# Patient Record
Sex: Female | Born: 1985 | Race: Black or African American | Hispanic: No | Marital: Single | Smoking: Never smoker
Health system: Southern US, Community
[De-identification: ages and names within clinical notes are randomized; demographics above are authoritative.]

## PROBLEM LIST (undated history)

## (undated) ENCOUNTER — Inpatient Hospital Stay: Payer: Self-pay

## (undated) DIAGNOSIS — D352 Benign neoplasm of pituitary gland: Secondary | ICD-10-CM

## (undated) DIAGNOSIS — G5751 Tarsal tunnel syndrome, right lower limb: Secondary | ICD-10-CM

## (undated) DIAGNOSIS — G43909 Migraine, unspecified, not intractable, without status migrainosus: Secondary | ICD-10-CM

## (undated) DIAGNOSIS — J189 Pneumonia, unspecified organism: Secondary | ICD-10-CM

## (undated) DIAGNOSIS — M1990 Unspecified osteoarthritis, unspecified site: Secondary | ICD-10-CM

## (undated) DIAGNOSIS — K121 Other forms of stomatitis: Secondary | ICD-10-CM

## (undated) DIAGNOSIS — M352 Behcet's disease: Secondary | ICD-10-CM

## (undated) DIAGNOSIS — L519 Erythema multiforme, unspecified: Secondary | ICD-10-CM

## (undated) DIAGNOSIS — M722 Plantar fascial fibromatosis: Secondary | ICD-10-CM

## (undated) DIAGNOSIS — M62461 Contracture of muscle, right lower leg: Secondary | ICD-10-CM

## (undated) DIAGNOSIS — J45909 Unspecified asthma, uncomplicated: Secondary | ICD-10-CM

## (undated) DIAGNOSIS — R0600 Dyspnea, unspecified: Secondary | ICD-10-CM

## (undated) HISTORY — PX: OTHER SURGICAL HISTORY: SHX169

---
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
## 2007-06-21 ENCOUNTER — Emergency Department: Payer: Self-pay | Admitting: Emergency Medicine

## 2007-08-13 ENCOUNTER — Ambulatory Visit: Payer: Self-pay | Admitting: Family Medicine

## 2007-11-01 ENCOUNTER — Ambulatory Visit: Payer: Self-pay | Admitting: Certified Nurse Midwife

## 2008-03-03 ENCOUNTER — Emergency Department: Payer: Self-pay | Admitting: Internal Medicine

## 2008-03-14 ENCOUNTER — Ambulatory Visit: Payer: Self-pay | Admitting: Pediatrics

## 2008-12-11 DIAGNOSIS — D352 Benign neoplasm of pituitary gland: Secondary | ICD-10-CM

## 2008-12-11 HISTORY — DX: Benign neoplasm of pituitary gland: D35.2

## 2009-07-03 ENCOUNTER — Emergency Department: Payer: Self-pay | Admitting: Internal Medicine

## 2010-07-07 ENCOUNTER — Emergency Department: Payer: Self-pay | Admitting: Emergency Medicine

## 2010-12-11 DIAGNOSIS — J45909 Unspecified asthma, uncomplicated: Secondary | ICD-10-CM

## 2010-12-11 HISTORY — DX: Unspecified asthma, uncomplicated: J45.909

## 2011-01-10 ENCOUNTER — Emergency Department: Payer: Self-pay | Admitting: Unknown Physician Specialty

## 2011-05-01 ENCOUNTER — Ambulatory Visit: Payer: Self-pay | Admitting: Family Medicine

## 2011-08-02 ENCOUNTER — Emergency Department: Payer: Self-pay | Admitting: *Deleted

## 2011-11-13 ENCOUNTER — Emergency Department (HOSPITAL_COMMUNITY)
Admission: EM | Admit: 2011-11-13 | Discharge: 2011-11-14 | Disposition: A | Discharge status: Home / Self Care | Payer: Self-pay | Attending: Emergency Medicine | Admitting: Emergency Medicine

## 2011-11-13 ENCOUNTER — Encounter: Payer: Self-pay | Admitting: Emergency Medicine

## 2011-11-13 ENCOUNTER — Emergency Department (HOSPITAL_COMMUNITY): Payer: Self-pay

## 2011-11-13 DIAGNOSIS — S139XXA Sprain of joints and ligaments of unspecified parts of neck, initial encounter: Secondary | ICD-10-CM | POA: Insufficient documentation

## 2011-11-13 DIAGNOSIS — IMO0002 Reserved for concepts with insufficient information to code with codable children: Secondary | ICD-10-CM

## 2011-11-13 DIAGNOSIS — Z331 Pregnant state, incidental: Secondary | ICD-10-CM

## 2011-11-13 DIAGNOSIS — S239XXA Sprain of unspecified parts of thorax, initial encounter: Secondary | ICD-10-CM | POA: Insufficient documentation

## 2011-11-13 DIAGNOSIS — M542 Cervicalgia: Secondary | ICD-10-CM | POA: Insufficient documentation

## 2011-11-13 DIAGNOSIS — O99891 Other specified diseases and conditions complicating pregnancy: Secondary | ICD-10-CM | POA: Insufficient documentation

## 2011-11-13 DIAGNOSIS — M549 Dorsalgia, unspecified: Secondary | ICD-10-CM | POA: Insufficient documentation

## 2011-11-13 DIAGNOSIS — S161XXA Strain of muscle, fascia and tendon at neck level, initial encounter: Secondary | ICD-10-CM
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
## 2011-11-15 ENCOUNTER — Encounter (HOSPITAL_COMMUNITY): Payer: Self-pay

## 2011-11-15 ENCOUNTER — Emergency Department (HOSPITAL_COMMUNITY)
Admission: EM | Admit: 2011-11-15 | Discharge: 2011-11-15 | Discharge status: Against Medical Advice | Payer: Self-pay | Attending: Emergency Medicine | Admitting: Emergency Medicine

## 2011-11-15 DIAGNOSIS — Z0389 Encounter for observation for other suspected diseases and conditions ruled out: Secondary | ICD-10-CM | POA: Insufficient documentation

## 2011-11-15 NOTE — ED Notes (Signed)
Pt reports she has had vaginal bleeding x 1 episode, reports having a vaginal discharge at present.

## 2011-11-15 NOTE — ED Notes (Signed)
Left post triage 
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
## 2012-03-16 ENCOUNTER — Emergency Department: Payer: Self-pay | Admitting: Emergency Medicine

## 2012-04-19 ENCOUNTER — Emergency Department: Payer: Self-pay | Admitting: Emergency Medicine

## 2012-04-19 LAB — URINALYSIS, COMPLETE
Bilirubin,UR: NEGATIVE
RBC,UR: 2 /HPF (ref 0–5)
Specific Gravity: 1.025 (ref 1.003–1.030)
Squamous Epithelial: 1
WBC UR: 1 /HPF (ref 0–5)

## 2012-04-19 LAB — COMPREHENSIVE METABOLIC PANEL
Albumin: 3.8 g/dL (ref 3.4–5.0)
Alkaline Phosphatase: 58 U/L (ref 50–136)
Anion Gap: 6 — ABNORMAL LOW (ref 7–16)
Chloride: 109 mmol/L — ABNORMAL HIGH (ref 98–107)
Co2: 25 mmol/L (ref 21–32)
EGFR (Non-African Amer.): >60
Osmolality: 278 (ref 275–301)
Potassium: 3.9 mmol/L (ref 3.5–5.1)

## 2012-04-19 LAB — CBC
HGB: 13.4 g/dL (ref 12.0–16.0)
MCH: 30.7 pg (ref 26.0–34.0)
MCHC: 33.5 g/dL (ref 32.0–36.0)
Platelet: 284 10*3/uL (ref 150–440)
WBC: 5.7 10*3/uL (ref 3.6–11.0)

## 2012-04-19 LAB — PREGNANCY, URINE: Pregnancy Test, Urine: NEGATIVE m[IU]/mL

## 2012-04-30 ENCOUNTER — Observation Stay: Payer: Self-pay | Admitting: Internal Medicine

## 2012-04-30 LAB — BASIC METABOLIC PANEL
Anion Gap: 12 (ref 7–16)
BUN: 9 mg/dL (ref 7–18)
Creatinine: 0.9 mg/dL (ref 0.60–1.30)
EGFR (African American): >60
EGFR (Non-African Amer.): >60
Glucose: 92 mg/dL (ref 65–99)

## 2012-04-30 LAB — CBC WITH DIFFERENTIAL/PLATELET
Eosinophil #: 0 10*3/uL (ref 0.0–0.7)
HCT: 42.1 % (ref 35.0–47.0)
Neutrophil #: 13.8 10*3/uL — ABNORMAL HIGH (ref 1.4–6.5)
Platelet: 320 10*3/uL (ref 150–440)
RBC: 4.55 10*6/uL (ref 3.80–5.20)

## 2012-04-30 LAB — PREGNANCY, URINE: Pregnancy Test, Urine: NEGATIVE m[IU]/mL

## 2012-09-11 LAB — HM HIV SCREENING LAB: HM HIV Screening: NEGATIVE

## 2012-09-21 ENCOUNTER — Emergency Department: Payer: Self-pay | Admitting: Emergency Medicine

## 2013-01-29 ENCOUNTER — Emergency Department: Payer: Self-pay | Admitting: Emergency Medicine

## 2013-03-19 ENCOUNTER — Emergency Department: Payer: Self-pay | Admitting: Emergency Medicine

## 2013-03-19 LAB — URINALYSIS, COMPLETE
Blood: NEGATIVE
Glucose,UR: NEGATIVE mg/dL (ref 0–75)
Leukocyte Esterase: NEGATIVE
Ph: 6 (ref 4.5–8.0)
Protein: NEGATIVE
WBC UR: 1 /HPF (ref 0–5)

## 2013-07-23 ENCOUNTER — Emergency Department: Payer: Self-pay | Admitting: Emergency Medicine

## 2013-09-24 ENCOUNTER — Emergency Department: Payer: Self-pay | Admitting: Emergency Medicine

## 2013-12-16 ENCOUNTER — Emergency Department: Payer: Self-pay | Admitting: Emergency Medicine

## 2013-12-16 LAB — RAPID INFLUENZA A&B ANTIGENS

## 2013-12-19 LAB — BETA STREP CULTURE(ARMC)

## 2014-04-04 ENCOUNTER — Emergency Department: Payer: Self-pay | Admitting: Emergency Medicine

## 2014-09-20 ENCOUNTER — Emergency Department: Payer: Self-pay | Admitting: Emergency Medicine

## 2014-10-12 ENCOUNTER — Encounter (HOSPITAL_COMMUNITY): Payer: Self-pay

## 2015-02-08 ENCOUNTER — Emergency Department: Payer: Self-pay | Admitting: Emergency Medicine

## 2015-02-24 LAB — HM PAP SMEAR: HM Pap smear: NEGATIVE

## 2015-04-04 ENCOUNTER — Emergency Department
Admit: 2015-04-04 | Disposition: A | Discharge status: Home / Self Care | Payer: Self-pay | Admitting: Emergency Medicine

## 2015-04-04 NOTE — Discharge Summary (Signed)
PATIENT NAMEELISA, [name] MR#:  [id number] DATE OF BIRTH:  Jun 21, 1986  DATE OF ADMISSION:  04/30/2012 DATE OF DISCHARGE:  05/02/2012  DISCHARGE DIAGNOSES:  1. Angioedema of unclear etiology.  2. Leukocytosis.   DISPOSITION: The patient is being discharged home. Follow-up with PCP in 1 to 2 weeks after discharge.   DIET: Regular.   ACTIVITY: As tolerated.   DISCHARGE MEDICATIONS:  1. Pepcid 20 mg daily.  2. Benadryl 25 mg every six hours p.r.n.  3. Prednisone taper as prescribed.  RESULTS:  CBC normal other than white count of 14.3.  BMP normal.  HOSPITAL COURSE: The patient is a 29 year old female with no significant past medical history who presented with swelling of her tongue and lips. The patient reported that this was her third episode in the last month. She denied any allergies or having similar symptoms in the past at a younger age. She has no family history of similar symptoms. She came to the ER during one of those episodes and a C4 level was checked at that time and was normal. She also had C1 esterase beta levels checked on 04/22/2012 which were normal. The patient was admitted to the hospital and started on p.r.n. Benadryl, Pepcid, and steroids with improvement. Throughout the hospitalization, the patient was not in any respiratory distress with good oxygen saturations. She was able to swallow and handle her secretions without any difficulty. With conservative management she remained stable. She is being discharged home in a stable condition. She has been advised to follow-up at [facility] if she has recurrent of her symptoms.   TIME SPENT: 45 minutes.   ____________________________ Cherre Huger, MD sp:slb D: 05/02/2012 13:03:40 ET T: 05/03/2012 12:05:24 ET JOB#: [id number]  cc: Cherre Huger, MD, <Dictator> Cherre Huger MD ELECTRONICALLY SIGNED 05/03/2012 14:00

## 2015-04-04 NOTE — H&P (Signed)
PATIENT NAMEAMARYAH, [name] MR#:  [id number] DATE OF BIRTH:  1986/10/26  DATE OF ADMISSION:  04/30/2012  PRIMARY CARE PHYSICIAN: None.   CHIEF COMPLAINT: Angioedema.   HISTORY OF PRESENT ILLNESS: The patient is a 29 year old female with no past medical history who presents with angioedema. She has had three episodes of angioedema in the last month. She says her tongue is swollen and her lips are swollen. No other symptoms are associated with this except for the fact that she says that she has a mildly sore throat. No fever or chills or drooling is noted. She has no respiratory compromise.   REVIEW OF SYSTEMS: CONSTITUTIONAL: No fever, fatigue, weakness, weight loss, weight gain. EYES: No blurred or double vision, glaucoma. ENT: No ear pain, hearing loss, seasonal allergies. No postnasal drip. RESPIRATORY: No cough, wheezing, hemoptysis, or dyspnea. CARDIOVASCULAR: No chest pain, orthopnea, palpitations, syncope, edema, arrhythmia, dyspnea on exertion. GI: No nausea, vomiting, diarrhea, abdominal pain, melena, or ulcers. GENITOURINARY: No dysuria, hematuria, frequency, or urgency. ENDOCRINE: No polyuria, polydipsia, thyroid problems, heat or cold intolerance. SKIN: No rash or lesions. HEMATOLOGIC/LYMPHATIC: No anemia or easy bruising. MUSCULOSKELETAL: No limited activity. No arthritis. NEUROLOGICAL:  No history of cerebrovascular accident, transient ischemic attack, seizures or ataxia. PSYCHIATRIC: No history of anxiety or depression.   PAST MEDICAL HISTORY: None.   PAST SURGICAL HISTORY: Cesarean section. Marland Kitchen   MEDICATIONS: Depo shot every three months, the last one was in April.   SOCIAL HISTORY: No tobacco, alcohol, or drug use.   FAMILY HISTORY: No history of hypertension, diabetes, or stroke. Marland Kitchen   ALLERGIES: No known drug allergies.  PHYSICAL EXAMINATION:  VITAL SIGNS: Temperature 99, pulse 92, respirations 20, blood pressure 107/71, 99% on room air.   GENERAL: The patient is alert,  oriented, not in acute distress.   HEENT: Head is atraumatic. Pupils are round and reactive. Sclerae are anicteric. The patient has a swollen tongue without any respiratory compromise, and her lips are also swollen.   NECK: Supple without jugular venous distention, carotid bruit, or enlarged thyroid. No lymphadenopathy.   CARDIOVASCULAR: Regular rate and rhythm. No murmurs, gallops, or rubs. PMI is not displaced.   LUNGS: Clear to auscultation bilaterally without crackles, rales, rhonchi or wheezing. Normal to percussion.   BACK: No costovertebral angle tenderness or vertebral tenderness.   ABDOMEN: Bowel sounds are positive. Nontender, nondistended. No hepatosplenomegaly.   EXTREMITIES: No clubbing, cyanosis, or edema.   NEUROLOGICAL:   Cranial nerves II through XII are intact. No focal deficits.   MUSCULOSKELETALL:  The patient is able to move all extremities with 5 out of 5 strength.   LABORATORY, DIAGNOSTIC AND RADIOLOGICAL DATA: Laboratories are pending.   ASSESSMENT AND PLAN: A 29 year old female with her third episode of angioedema, without any significant respiratory compromise at this time, who is admitted for observation.   Angioedema of unclear etiology: Likely idiopathic versus hereditary versus an allergen. The patient denies any new exposures, nothing has changed.  She is not on any medications that could possibly cause angioedema and no family history. At this time, this is idiopathic acquired angioedema. She has no evidence of stridor, respiratory distress, or decreasing oxygen level. She   will be admitted for observation. We will continue Solu-Medrol, Pepcid and Benadryl. She will need an outpatient allergist at discharge.   ____________________________ Jehan Ranganathan P. Benjie Karvonen, MD spm:cbb D: 04/30/2012 14:57:55 ET T: 04/30/2012 15:36:56 ET JOB#: [id number]  cc: Mattheu Brodersen P. Benjie Karvonen, MD, <Dictator> Donell Beers Jaelen Gellerman MD ELECTRONICALLY SIGNED 04/30/2012  16:54 

## 2015-04-07 LAB — BETA STREP CULTURE(ARMC)

## 2015-05-01 ENCOUNTER — Emergency Department
Admission: EM | Admit: 2015-05-01 | Discharge: 2015-05-02 | Discharge status: Against Medical Advice | Payer: Medicaid Other | Attending: Emergency Medicine | Admitting: Emergency Medicine

## 2015-05-01 DIAGNOSIS — Z880 Allergy status to penicillin: Secondary | ICD-10-CM | POA: Diagnosis not present

## 2015-05-01 DIAGNOSIS — Z331 Pregnant state, incidental: Secondary | ICD-10-CM | POA: Diagnosis not present

## 2015-05-01 DIAGNOSIS — R1030 Lower abdominal pain, unspecified: Secondary | ICD-10-CM | POA: Diagnosis present

## 2015-05-01 LAB — URINALYSIS COMPLETE WITH MICROSCOPIC (ARMC ONLY)
BACTERIA UA: NONE SEEN
BILIRUBIN URINE: NEGATIVE
GLUCOSE, UA: NEGATIVE mg/dL
HGB URINE DIPSTICK: NEGATIVE
Ketones, ur: NEGATIVE mg/dL
Leukocytes, UA: NEGATIVE
NITRITE: NEGATIVE
Protein, ur: NEGATIVE mg/dL
Specific Gravity, Urine: 1.024 (ref 1.005–1.030)
pH: 6 (ref 5.0–8.0)

## 2015-05-01 LAB — CBC WITH DIFFERENTIAL/PLATELET
BASOS PCT: 1 %
Basophils Absolute: 0.1 10*3/uL (ref 0–0.1)
EOS ABS: 0.1 10*3/uL (ref 0–0.7)
Eosinophils Relative: 2 %
HCT: 38 % (ref 35.0–47.0)
Hemoglobin: 12.7 g/dL (ref 12.0–16.0)
LYMPHS ABS: 3 10*3/uL (ref 1.0–3.6)
Lymphocytes Relative: 42 %
MCH: 29.9 pg (ref 26.0–34.0)
MCHC: 33.3 g/dL (ref 32.0–36.0)
MCV: 89.9 fL (ref 80.0–100.0)
MONO ABS: 0.8 10*3/uL (ref 0.2–0.9)
MONOS PCT: 11 %
Neutro Abs: 3.1 10*3/uL (ref 1.4–6.5)
Neutrophils Relative %: 44 %
Platelets: 356 10*3/uL (ref 150–440)
RBC: 4.23 MIL/uL (ref 3.80–5.20)
RDW: 12.9 % (ref 11.5–14.5)
WBC: 7.1 10*3/uL (ref 3.6–11.0)

## 2015-05-01 LAB — COMPREHENSIVE METABOLIC PANEL
ALK PHOS: 62 U/L (ref 38–126)
ALT: 12 U/L — ABNORMAL LOW (ref 14–54)
ANION GAP: 6 (ref 5–15)
AST: 16 U/L (ref 15–41)
Albumin: 3.6 g/dL (ref 3.5–5.0)
BUN: 7 mg/dL (ref 6–20)
CALCIUM: 9.2 mg/dL (ref 8.9–10.3)
CO2: 27 mmol/L (ref 22–32)
Chloride: 106 mmol/L (ref 101–111)
Creatinine, Ser: 0.92 mg/dL (ref 0.44–1.00)
GFR calc non Af Amer: >60 mL/min (ref >60)
Glucose, Bld: 79 mg/dL (ref 65–99)
Potassium: 3.4 mmol/L — ABNORMAL LOW (ref 3.5–5.1)
SODIUM: 139 mmol/L (ref 135–145)
Total Bilirubin: 0.4 mg/dL (ref 0.3–1.2)
Total Protein: 7.4 g/dL (ref 6.5–8.1)

## 2015-05-01 LAB — POCT PREGNANCY, URINE: PREG TEST UR: POSITIVE — AB

## 2015-05-01 NOTE — ED Notes (Signed)
Lab notified to add hcg to blood work

## 2015-05-01 NOTE — Discharge Instructions (Signed)
Abdominal Pain During Pregnancy Abdominal pain is common in pregnancy. Most of the time, it does not cause harm. There are many causes of abdominal pain. Some causes are more serious than others. Some of the causes of abdominal pain in pregnancy are easily diagnosed. Occasionally, the diagnosis takes time to understand. Other times, the cause is not determined. Abdominal pain can be a sign that something is very wrong with the pregnancy, or the pain may have nothing to do with the pregnancy at all. For this reason, always tell your health care provider if you have any abdominal discomfort. HOME CARE INSTRUCTIONS  Monitor your abdominal pain for any changes. The following actions may help to alleviate any discomfort you are experiencing:  Do not have sexual intercourse or put anything in your vagina until your symptoms go away completely.  Get plenty of rest until your pain improves.  Drink clear fluids if you feel nauseous. Avoid solid food as long as you are uncomfortable or nauseous.  Only take over-the-counter or prescription medicine as directed by your health care provider.  Keep all follow-up appointments with your health care provider. SEEK IMMEDIATE MEDICAL CARE IF:  You are bleeding, leaking fluid, or passing tissue from the vagina.  You have increasing pain or cramping.  You have persistent vomiting.  You have painful or bloody urination.  You have a fever.  You notice a decrease in your baby's movements.  You have extreme weakness or feel faint.  You have shortness of breath, with or without abdominal pain.  You develop a severe headache with abdominal pain.  You have abnormal vaginal discharge with abdominal pain.  You have persistent diarrhea.  You have abdominal pain that continues even after rest, or gets worse. MAKE SURE YOU:   Understand these instructions.  Will watch your condition.  Will get help right away if you are not doing well or get  worse. Document Released: 11/27/2005 Document Revised: 09/17/2013 Document Reviewed: 06/26/2013 [facility] Patient Information 2015 [place], [place]. This information is not intended to replace advice given to you by your health care provider. Make sure you discuss any questions you have with your health care providerPlease be sure to follow up with OB very soon. RETURN AT ONCE IF ABDOMINAL PAIN GETS WORSE OR IF  YOU HAVE BLEEDING OR A FEVER.

## 2015-05-01 NOTE — ED Notes (Signed)
Patient states she wants to leave, she has to work at [employer] and has to be up at [employer].  She does not want to wait for [place] and Pelvic Exam.  Dr. Cinda Quest notified.

## 2015-05-01 NOTE — ED Notes (Signed)
Pt c/o abd pain and n/v since Monday. Denies vaginal bleeding or discharge. Reports being 1 week late for menstrual cycle.

## 2015-05-01 NOTE — ED Provider Notes (Signed)
[facility] Emergency Department Provider Note  ____________________________________________  Time seen: Approximately 11:21 PM  I have reviewed the triage vital signs and the nursing notes.   HISTORY  Chief Complaint Abdominal Pain    HPI [name] is a 29 y.o. female who complains of lower abdominal pain starting today at somewhat crampy in nature gets better when she passes gas she also reports she's been nauseated for the last day or so which she smells food nothing else really seems to modify the pain or nausea is mild in natur and comes and goes especially after goes after she passes gas e   No past medical history on file.  There are no active problems to display for this patient.   Past Surgical History  Procedure Laterality Date  . Cesarean section      No current outpatient prescriptions on file.  Allergies Ibuprofen and Penicillins  No family history on file.  Social History History  Substance Use Topics  . Smoking status: Never Smoker   . Smokeless tobacco: Not on file  . Alcohol Use: No    Review of Systems Constitutional: No fever/chills Eyes: No visual changes. ENT: No sore throat. Cardiovascular: Denies chest pain. Respiratory: Denies shortness of breath. Gastrointestinal:  no vomiting.  No diarrhea.  No constipation. Genitourinary: Negative for dysuria. Musculoskeletal: Negative for back pain. Skin: Negative for rash. Neurological: Negative for headaches, focal weakness or numbness.  10-point ROS otherwise negative.  ____________________________________________   PHYSICAL EXAM:  VITAL SIGNS: ED Triage Vitals  Enc Vitals Group     BP 05/01/15 1943 120/67 mmHg     Pulse Rate 05/01/15 1943 86     Resp 05/01/15 2230 18     Temp 05/01/15 1943 97.6 F (36.4 C)     Temp Source 05/01/15 1943 Oral     SpO2 05/01/15 1943 100 %     Weight 05/01/15 1943 207 lb (93.895 kg)     Height 05/01/15 1943 5\' 4"   (1.626 m)     Head Cir --      Peak Flow --      Pain Score 05/01/15 1944 6     Pain Loc --      Pain Edu? --      Excl. in [place]? --     Constitutional: Alert and oriented. Well appearing and in no acute distress. Eyes: Conjunctivae are normal. PERRL. EOMI. Head: Atraumatic. Nose: No congestion/rhinnorhea. Mouth/Throat: Mucous membranes are moist.  Oropharynx non-erythematous. Neck: No stridor.  }Cardiovascular: Normal rate, regular rhythm. Grossly normal heart sounds.  Good peripheral circulation. Respiratory: Normal respiratory effort.  No retractions. Lungs CTAB. Gastrointestinal: Soft and nontender. Except for mild tenderness on deep palpation immediately suprapubically No distention. No abdominal bruits. No CVA tenderness. Musculoskeletal: No lower extremity tenderness nor edema.  No joint effusions. Neurologic:  Normal speech and language. No gross focal neurologic deficits are appreciated. Speech is normal. No gait instability. Skin:  Skin is warm, dry and intact. No rash noted. Psychiatric: Mood and affect are normal. Speech and behavior are normal.  ____________________________________________   LABS (all labs ordered are listed, but only abnormal results are displayed)  Labs Reviewed  URINALYSIS COMPLETEWITH MICROSCOPIC ([place])  - Abnormal; Notable for the following:    Color, Urine YELLOW (*)    APPearance HAZY (*)    Squamous Epithelial / LPF 0-5 (*)    All other components within normal limits  COMPREHENSIVE METABOLIC PANEL - Abnormal; Notable for the following:  Potassium 3.4 (*)    ALT 12 (*)    All other components within normal limits  POCT PREGNANCY, URINE - Abnormal; Notable for the following:    Preg Test, Ur POSITIVE (*)    All other components within normal limits  CHLAMYDIA/NGC RT PCR ([facility])   CBC WITH DIFFERENTIAL/PLATELET  POC URINE PREG, ED   ____________________________________________  EKG  Not  done ____________________________________________  RADIOLOGY   ____________________________________________   PROCEDURES  Procedure(s) performed: None  Critical Care performed: No  ____________________________________________   INITIAL IMPRESSION / ASSESSMENT AND PLAN / ED COURSE  Pertinent labs & imaging results that were available during my care of the patient were reviewed by me and considered in my medical decision making (see chart for details).  Patient, who wanted to leave initially, but who I was able to talk and staying now wants to leave again I will allow her to leave since the pain is very mild and resolves when she passes gas, with the understanding that she will return at once if the pain gets worse or if she begins having any bleeding or has any other problems and with the understanding that she will follow up immediately with the OB/GYN doctor next days ____________________________________________   FINAL CLINICAL IMPRESSION(S) / ED DIAGNOSES  Final diagnoses:  Lower abdominal pain     Kim Polio, MD 05/01/15 2358

## 2015-05-02 LAB — HCG, QUANTITATIVE, PREGNANCY: hCG, Beta Chain, Quant, S: 6024 m[IU]/mL — ABNORMAL HIGH (ref <5)

## 2015-05-02 NOTE — ED Notes (Signed)
Patient decided to leave AMA.  AMA signature obtained.  Risks explained.  Information given on when to return to the ER and to follow up ASAP.

## 2015-05-03 ENCOUNTER — Emergency Department
Admission: EM | Admit: 2015-05-03 | Discharge: 2015-05-03 | Disposition: A | Discharge status: Home / Self Care | Payer: Medicaid Other | Attending: Emergency Medicine | Admitting: Emergency Medicine

## 2015-05-03 ENCOUNTER — Encounter: Payer: Self-pay | Admitting: Emergency Medicine

## 2015-05-03 DIAGNOSIS — O219 Vomiting of pregnancy, unspecified: Secondary | ICD-10-CM

## 2015-05-03 DIAGNOSIS — M545 Low back pain: Secondary | ICD-10-CM | POA: Insufficient documentation

## 2015-05-03 DIAGNOSIS — Z880 Allergy status to penicillin: Secondary | ICD-10-CM | POA: Diagnosis not present

## 2015-05-03 DIAGNOSIS — O210 Mild hyperemesis gravidarum: Secondary | ICD-10-CM | POA: Insufficient documentation

## 2015-05-03 DIAGNOSIS — Z3A01 Less than 8 weeks gestation of pregnancy: Secondary | ICD-10-CM | POA: Diagnosis not present

## 2015-05-03 DIAGNOSIS — O9989 Other specified diseases and conditions complicating pregnancy, childbirth and the puerperium: Secondary | ICD-10-CM | POA: Diagnosis not present

## 2015-05-03 MED ORDER — PROMETHAZINE HCL 25 MG PO TABS
12.5000 mg | ORAL_TABLET | Freq: Once | ORAL | Status: AC
  Administered 2015-05-03: 12.5 mg via ORAL

## 2015-05-03 MED ORDER — PROMETHAZINE HCL 25 MG PO TABS
ORAL_TABLET | ORAL | Status: AC
  Administered 2015-05-03: 12.5 mg via ORAL
  Filled 2015-05-03: qty 1

## 2015-05-03 MED ORDER — DOXYLAMINE-PYRIDOXINE 10-10 MG PO TBEC: 2.0000 | DELAYED_RELEASE_TABLET | Freq: Every day | ORAL | Status: DC

## 2015-05-03 NOTE — ED Provider Notes (Signed)
[facility] Emergency Department Provider Note  ____________________________________________  Time seen: Approximately 4:45 PM  I have reviewed the triage vital signs and the nursing notes.   HISTORY  Chief Complaint Emesis During Pregnancy    HPI [name] is a 29 y.o. female presents to the emergency department for nausea during pregnancy. She denies vomiting or abdominal pain. She states that she is currently on an antibiotic for UTI diagnosed by [facility]. She states that she becomes extremely nauseated after eating. She found out approximately 1 week ago that she is pregnant. She is not been seen by gynecology. Last menstrual cycle was 03/27/2015. She is gravida 2 para 2 abortion 0 (twins in the first pregnancy).She denies vaginal bleeding, discharge, or fluid leaking.   History reviewed. No pertinent past medical history.  There are no active problems to display for this patient.   Past Surgical History  Procedure Laterality Date  . Cesarean section      Current Outpatient Rx  Name  Route  Sig  Dispense  Refill  . Doxylamine-Pyridoxine 10-10 MG TBEC   Oral   Take 2 tablets by mouth at bedtime.   60 tablet   0     Allergies Ibuprofen and Penicillins  No family history on file.  Social History History  Substance Use Topics  . Smoking status: Never Smoker   . Smokeless tobacco: Not on file  . Alcohol Use: No    Review of Systems Constitutional: No fever/chills Eyes: No visual changes. ENT: No sore throat. Cardiovascular: Denies chest pain. Respiratory: Denies shortness of breath. Gastrointestinal: No abdominal pain.  Nausea, no vomiting.  No diarrhea.  No constipation. Genitourinary: Negative for dysuria. Musculoskeletal: Lower  back pain.  Skin: Negative for rash. Neurological: Negative for headaches, focal weakness or numbness.  10-point ROS otherwise  negative.  ____________________________________________   PHYSICAL EXAM:  VITAL SIGNS: ED Triage Vitals  Enc Vitals Group     BP 05/03/15 1303 120/68 mmHg     Pulse Rate 05/03/15 1303 86     Resp 05/03/15 1303 18     Temp 05/03/15 1302 98 F (36.7 C)     Temp Source 05/03/15 1302 Oral     SpO2 05/03/15 1303 100 %     Weight --      Height --      Head Cir --      Peak Flow --      Pain Score 05/03/15 1304 0     Pain Loc --      Pain Edu? --      Excl. in [place]? --     Constitutional: Alert and oriented. Well appearing and in no acute distress. Eyes: Conjunctivae are normal. PERRL. EOMI. Head: Atraumatic. Nose: No congestion/rhinnorhea. Mouth/Throat: Mucous membranes are moist.  Oropharynx non-erythematous. Neck: No stridor.   Cardiovascular: Normal rate, regular rhythm. Grossly normal heart sounds.  Good peripheral circulation. Respiratory: Normal respiratory effort.  No retractions. Lungs CTAB. Gastrointestinal: Soft and nontender. No distention. No abdominal bruits. No CVA tenderness. Musculoskeletal: No lower extremity tenderness nor edema.  No joint effusions. Neurologic:  Normal speech and language. No gross focal neurologic deficits are appreciated. Speech is normal. No gait instability. Skin:  Skin is warm, dry and intact. No rash noted. Psychiatric: Mood and affect are normal. Speech and behavior are normal.  ____________________________________________   LABS (all labs ordered are listed, but only abnormal results are displayed)  Labs Reviewed - No data to display  ____________________________________________  EKG   ____________________________________________  RADIOLOGY   ____________________________________________   PROCEDURES  Procedure(s) performed: None  Critical Care performed: No  ____________________________________________   INITIAL IMPRESSION / ASSESSMENT AND PLAN / ED COURSE  Pertinent labs & imaging results that were available  during my care of the patient were reviewed by me and considered in my medical decision making (see chart for details). Patient was given by mouth Phenergan while in the emergency department. She was then able to tolerate a full food tray without vomiting or nausea. She'll be discharged home to follow up with her primary care provider. She states that she intends to see [facility] to follow her through pregnancy. ____________________________________________   FINAL CLINICAL IMPRESSION(S) / ED DIAGNOSES  Final diagnoses:  Nausea and vomiting in pregnancy prior to [redacted] weeks gestation      Victorino Dike, FNP 05/03/15 1745  Hinda Kehr, MD 05/04/15 [phone]

## 2015-05-03 NOTE — ED Notes (Signed)
Patient states that she was here 2 days ago for same, had lab work but did not stay to get results, denies abd pain or bleeding, states has not tried anything for this nausea and vomiting yet.

## 2015-05-03 NOTE — ED Notes (Signed)
Today is hungry , last vomit was 10 days ago , has had some nausea, has had no prenatal care, lmp April 16 was aseen at urgent care for chills nausea, found out she was preg

## 2015-05-03 NOTE — ED Notes (Signed)
Last period 4/16

## 2015-05-10 ENCOUNTER — Emergency Department
Admission: EM | Admit: 2015-05-10 | Discharge: 2015-05-10 | Disposition: A | Discharge status: Home / Self Care | Payer: Medicaid Other | Attending: Emergency Medicine | Admitting: Emergency Medicine

## 2015-05-10 ENCOUNTER — Encounter: Payer: Self-pay | Admitting: Emergency Medicine

## 2015-05-10 DIAGNOSIS — Z880 Allergy status to penicillin: Secondary | ICD-10-CM | POA: Insufficient documentation

## 2015-05-10 DIAGNOSIS — B370 Candidal stomatitis: Secondary | ICD-10-CM | POA: Diagnosis not present

## 2015-05-10 DIAGNOSIS — O98811 Other maternal infectious and parasitic diseases complicating pregnancy, first trimester: Secondary | ICD-10-CM | POA: Insufficient documentation

## 2015-05-10 DIAGNOSIS — Z3A01 Less than 8 weeks gestation of pregnancy: Secondary | ICD-10-CM | POA: Insufficient documentation

## 2015-05-10 MED ORDER — ALUM & MAG HYDROXIDE-SIMETH 200-200-20 MG/5ML PO SUSP
ORAL | Status: AC
  Filled 2015-05-10: qty 30

## 2015-05-10 MED ORDER — NYSTATIN 100000 UNIT/ML MT SUSP: 5.0000 mL | Freq: Four times a day (QID) | OROMUCOSAL | Status: DC

## 2015-05-10 MED ORDER — LIDOCAINE VISCOUS 2 % MT SOLN
15.0000 mL | Freq: Once | OROMUCOSAL | Status: AC
  Administered 2015-05-10: 15 mL via OROMUCOSAL

## 2015-05-10 MED ORDER — OXYCODONE HCL 5 MG/5ML PO SOLN
ORAL | Status: AC
  Filled 2015-05-10: qty 5

## 2015-05-10 MED ORDER — LIDOCAINE VISCOUS 2 % MT SOLN
OROMUCOSAL | Status: DC
Start: 2015-05-10 — End: 2015-05-11
  Filled 2015-05-10: qty 15

## 2015-05-10 MED ORDER — LIDOCAINE VISCOUS 2 % MT SOLN: 20.0000 mL | OROMUCOSAL | Status: DC | PRN

## 2015-05-10 MED ORDER — NYSTATIN 100000 UNIT/ML MT SUSP
5.0000 mL | Freq: Four times a day (QID) | OROMUCOSAL | Status: DC
  Administered 2015-05-10: 500000 [IU] via ORAL
  Filled 2015-05-10 (×5): qty 5

## 2015-05-10 MED ORDER — GI COCKTAIL ~~LOC~~: 30.0000 mL | Freq: Once | ORAL | Status: DC

## 2015-05-10 MED ORDER — OXYCODONE HCL 5 MG/5ML PO SOLN: 5.0000 mg | ORAL | Status: DC | PRN

## 2015-05-10 MED ORDER — ALUM & MAG HYDROXIDE-SIMETH 200-200-20 MG/5ML PO SUSP
15.0000 mL | Freq: Once | ORAL | Status: AC
  Administered 2015-05-10: 15 mL via ORAL

## 2015-05-10 MED ORDER — OXYCODONE HCL 5 MG/5ML PO SOLN
7.5000 mg | Freq: Once | ORAL | Status: AC
  Administered 2015-05-10: 7.5 mg via ORAL

## 2015-05-10 NOTE — ED Provider Notes (Signed)
[facility] Emergency Department Provider Note  Time seen: 9:20 PM  I have reviewed the triage vital signs and the nursing notes.   HISTORY  Chief Complaint Allergic Reaction    HPI [name] is a 29 y.o. female with no past medical history who presents the emergency department with oral swelling and pain. The patient states she is approximately [redacted] weeks pregnant currently by her best estimate. Patient states for the past 2 days her mouth has been swollen with significant pain upon swallowing or trying to eat. She thinks this feels an allergic reaction to eating tomatoes several days ago. Denies any hives, itching, skin rash, trouble breathing. Describes some mouth pain as moderate, worse when trying to eat or drink.    History reviewed. No pertinent past medical history.  There are no active problems to display for this patient.   Past Surgical History  Procedure Laterality Date  . Cesarean section      Current Outpatient Rx  Name  Route  Sig  Dispense  Refill  . Doxylamine-Pyridoxine 10-10 MG TBEC   Oral   Take 2 tablets by mouth at bedtime.   60 tablet   0     Allergies Ibuprofen and Penicillins  No family history on file.  Social History History  Substance Use Topics  . Smoking status: Never Smoker   . Smokeless tobacco: Not on file  . Alcohol Use: No    Review of Systems Constitutional: Negative for fever. Eyes:  States bilateral pinkeye Cardiovascular: Negative for chest pain. Respiratory: Negative for shortness of breath. Gastrointestinal: Negative for abdominal pain Genitourinary: Negative for vaginal bleeding or discharge. Musculoskeletal: Negative for back pain. Skin: Negative for rash. Neurological: Negative for headache 10-point ROS otherwise negative.  ____________________________________________   PHYSICAL EXAM:  VITAL SIGNS: ED Triage Vitals  Enc Vitals Group     BP 05/10/15 2113 106/65 mmHg     Pulse  Rate 05/10/15 2113 103     Resp --      Temp 05/10/15 2113 100.5 F (38.1 C)     Temp Source 05/10/15 2113 Oral     SpO2 05/10/15 2113 100 %     Weight 05/10/15 2113 205 lb (92.987 kg)     Height 05/10/15 2113 5\' 4"  (1.626 m)     Head Cir --      Peak Flow --      Pain Score 05/10/15 2118 10     Pain Loc --      Pain Edu? --      Excl. in [place]? --     Constitutional: Alert and oriented. Well appearing and in no distress. Keeps mouth closed, only speaks one word sentences due to pain while speaking. Eyes: Bilateral conjunctival injection, no drainage noted. ENT   Head: Normocephalic and atraumatic.   Nose: No congestion/rhinnorhea.   Mouth/Throat: Diffuse white plaques throughout mouth, tongue and buccal mucosa. Skin appears to be sloughing off in areas of the buccal mucosa. Most consistent with significant/severe oral thrush. Cardiovascular: Normal rate, regular rhythm. Respiratory: Normal respiratory effort without tachypnea nor retractions. Breath sounds are clear. No wheeze. Gastrointestinal: Soft and nontender. No distention.   Musculoskeletal: Nontender with normal range of motion in all extremities.  Neurologic:  Normal speech and language. No gross focal neurologic deficits  Skin:  Skin is warm, dry and intact. No rash. No hives. Psychiatric: Mood and affect are normal. Speech and behavior are normal  ____________________________________________   INITIAL IMPRESSION /  ASSESSMENT AND PLAN / ED COURSE  Pertinent labs & imaging results that were available during my care of the patient were reviewed by me and considered in my medical decision making (see chart for details).  6-[redacted] weeks pregnant female presents with what appears to be severe oral thrush. Does not appear to be an anaphylactic reaction or angioedema. Patient did receive 50 mg IV Benadryl by EMS prior to arrival. Unfortunately most pinkeye fungal medications are class C, I will discuss with OB/GYN for  further recommendations for treatment. I will dose the patient with viscous lidocaine in the emergency department for symptom relief. I discussed with Dr. Leafy Ro, we both reviewed literature on the subject. It appears that nystatin has the least risk to the patient/fetus as it is absorbed in very low quantities into the bloodstream from the GI tract. I will discuss the risks/benefits with the patient, but as this is a very significant thrush infection I believe it needs to be treated for the patient's well being. I have also discussed with the patient other immunosuppressive conditions such as HIV. The patient states she has been tested for HIV and and is always been negative. I discussed with the patient the need to follow up with OB/GYN this week and have repeat HIV testing performed. The patient is agreeable to this plan.  ____________________________________________   FINAL CLINICAL IMPRESSION(S) / ED DIAGNOSES  Oral thrush   Harvest Dark, MD 05/10/15 2326

## 2015-05-10 NOTE — Discharge Instructions (Signed)

## 2015-05-10 NOTE — ED Notes (Signed)
Pt arrived to the ED via [facility] for complaints of "swollen moth and throat." Pt states that she ate tomatoes 2 days ago and she is allergic to them. Pt reports having this symptoms for 2 days now and is getting worse. Pt is AOx4 in no apparent distress, speaking softly because of painful mouth, no hives or itching reported at the time of triage. MD at bedside upon arrival and ruled out allergic reaction diagnosis. Pt received 50mg  of benadryl. Pt reports being [redacted] weeks pregnant.

## 2015-05-12 ENCOUNTER — Encounter: Payer: Self-pay | Admitting: Emergency Medicine

## 2015-05-12 ENCOUNTER — Emergency Department
Admission: EM | Admit: 2015-05-12 | Discharge: 2015-05-12 | Disposition: A | Discharge status: Home / Self Care | Payer: Medicaid Other | Attending: Emergency Medicine | Admitting: Emergency Medicine

## 2015-05-12 DIAGNOSIS — O99711 Diseases of the skin and subcutaneous tissue complicating pregnancy, first trimester: Secondary | ICD-10-CM | POA: Diagnosis present

## 2015-05-12 DIAGNOSIS — Z79899 Other long term (current) drug therapy: Secondary | ICD-10-CM | POA: Diagnosis not present

## 2015-05-12 DIAGNOSIS — L519 Erythema multiforme, unspecified: Secondary | ICD-10-CM

## 2015-05-12 DIAGNOSIS — Z880 Allergy status to penicillin: Secondary | ICD-10-CM | POA: Diagnosis not present

## 2015-05-12 DIAGNOSIS — Z3A00 Weeks of gestation of pregnancy not specified: Secondary | ICD-10-CM | POA: Diagnosis not present

## 2015-05-12 MED ORDER — SODIUM CHLORIDE 0.9 % IV SOLN
Freq: Once | INTRAVENOUS | Status: AC
  Administered 2015-05-12: 10:00:00 via INTRAVENOUS

## 2015-05-12 MED ORDER — DEXAMETHASONE SODIUM PHOSPHATE 10 MG/ML IJ SOLN
10.0000 mg | Freq: Once | INTRAMUSCULAR | Status: AC
  Administered 2015-05-12: 10 mg via INTRAVENOUS

## 2015-05-12 MED ORDER — DEXAMETHASONE SODIUM PHOSPHATE 10 MG/ML IJ SOLN
INTRAMUSCULAR | Status: AC
  Filled 2015-05-12: qty 1

## 2015-05-12 MED ORDER — PREDNISONE 10 MG PO TABS: 10.0000 mg | ORAL_TABLET | Freq: Every day | ORAL | Status: DC

## 2015-05-12 NOTE — ED Provider Notes (Signed)
[facility] Emergency Department Provider Note     Time seen: ----------------------------------------- 9:01 AM on 05/12/2015 -----------------------------------------    I have reviewed the triage vital signs and the nursing notes.   HISTORY  Chief Complaint Sore Throat    HPI [name] is a 29 y.o. female who presents ER for severe sore throat and mouth swelling. Patient states she's been having hard time swallowing, she was seen here 2 days ago for same but on antifungal medications. She states is worse she said would eat or drink anything and she is 2 months pregnant. States this happened about 3 months ago she was treated in [place] on her symptoms Better. Patient states throat and mouth pain is severe    History reviewed. No pertinent past medical history.  There are no active problems to display for this patient.   Past Surgical History  Procedure Laterality Date  . Cesarean section      Current Outpatient Rx  Name  Route  Sig  Dispense  Refill  . Doxylamine-Pyridoxine 10-10 MG TBEC   Oral   Take 2 tablets by mouth at bedtime.   60 tablet   0   . lidocaine (XYLOCAINE) 2 % solution   Mouth/Throat   Use as directed 20 mLs in the mouth or throat as needed for mouth pain.   100 mL   0   . nystatin (MYCOSTATIN) 100000 UNIT/ML suspension   Oral   Take 5 mLs (500,000 Units total) by mouth 4 (four) times daily.   240 mL   0   . oxyCODONE (ROXICODONE) 5 MG/5ML solution   Oral   Take 5 mLs (5 mg total) by mouth every 4 (four) hours as needed for severe pain.   100 mL   0     Allergies Tomato; Ibuprofen; and Penicillins  No family history on file.  Social History History  Substance Use Topics  . Smoking status: Never Smoker   . Smokeless tobacco: Never Used  . Alcohol Use: No    Review of Systems Constitutional: Negative for fever. Eyes: Negative for visual changes. ENT: Severe sore throat Cardiovascular:  Negative for chest pain. Respiratory: Negative for shortness of breath. Gastrointestinal: Negative for abdominal pain, vomiting and diarrhea. Genitourinary: Negative for dysuria. Musculoskeletal: Negative for back pain. Skin: Negative for rash. Neurological: Negative for headaches, focal weakness or numbness.  10-point ROS otherwise negative.  ____________________________________________   PHYSICAL EXAM:  VITAL SIGNS: ED Triage Vitals  Enc Vitals Group     BP 05/12/15 0854 108/65 mmHg     Pulse Rate 05/12/15 0854 104     Resp 05/12/15 0854 18     Temp 05/12/15 0854 99.4 F (37.4 C)     Temp Source 05/12/15 0854 Oral     SpO2 05/12/15 0854 99 %     Weight 05/12/15 0853 201 lb (91.173 kg)     Height 05/12/15 0853 5\' 4"  (1.626 m)     Head Cir --      Peak Flow --      Pain Score 05/12/15 0855 10     Pain Loc --      Pain Edu? --      Excl. in [place]? --     Constitutional: Alert and oriented. Well appearing and in no distress. Eyes: Conjunctivae are normal. PERRL. Normal extraocular movements. ENT   Head: Normocephalic and atraumatic.   Nose: No congestion/rhinnorhea.   Mouth/Throat: Diffuse white lesions and ulcerations noted on  the tongue and posterior pharynx with some superficial bleeding.   Neck: No stridor. Hematological/Lymphatic/Immunilogical: No cervical lymphadenopathy. Cardiovascular: Normal rate, regular rhythm. Normal and symmetric distal pulses are present in all extremities. No murmurs, rubs, or gallops. Respiratory: Normal respiratory effort without tachypnea nor retractions. Breath sounds are clear and equal bilaterally. No wheezes/rales/rhonchi. Gastrointestinal: Soft and nontender. No distention. No abdominal bruits. There is no CVA tenderness. Musculoskeletal: Nontender with normal range of motion in all extremities. No joint effusions.  No lower extremity tenderness nor edema. Neurologic: No gross focal neurologic deficits are appreciated.  Speech is normal. No gait instability. Skin:  Skin is warm, dry and intact. No rash noted. Psychiatric: Mood and affect are normal. Patient difficulty speaking due to oral pain  ____________________________________________    LABS (pertinent positives/negatives)  Labs Reviewed - No data to display ____________________________________________  ED COURSE:  Pertinent labs & imaging results that were available during my care of the patient were reviewed by me and considered in my medical decision making (see chart for details).  Patient will receive normal saline IV fluid. I reviewed her previous charts, she been seen numerous times for this at [facility]. These are severe lesions bleed to be associated with erythema multiforme. We'll discuss with the dermatologist at [facility] is been taking care of her. ____________________________________________   RADIOLOGY   None ____________________________________________    FINAL ASSESSMENT AND PLAN  Erythema multiforme  Plan: Patient with severe mucocutaneous lesions in the mouth. Discussed with her dermatologist, now that she is pregnant we are limited to the prednisone this time. Should be on 60 mg prednisone a day, she is supposed to follow up with the dermatologist tomorrow for reevaluation. We have given her normal saline hydration here. Stable for follow-up then, can continue Magic mouthwash but is advised to stop the nystatin    Earleen Newport, MD   Earleen Newport, MD 05/12/15 1213

## 2015-05-12 NOTE — ED Notes (Signed)
Pt states sore throat and swelling for past 4 days. Difficulty swallowing fluids white coating noted on tongue.

## 2015-05-12 NOTE — Discharge Instructions (Signed)
Erythema Multiforme Erythema multiforme (EM) is a rash that occurs mostly on the skin. Sometimes it occurs on the lips and mouth. It is usually a mild illness that goes away on its own. It usually affects young adults in the spring and fall. It tends to be recurrent with each episode lasting 1 to 4 weeks. CAUSES  The cause of EM may be an overreaction by the body's immune system to a trigger (something that causes the body to react).  Common triggers include:  Infections, including:  Viruses.  Bacteria.  Fungi.  Parasites.  Medicines. Less common triggers include:  Foods.  Chemicals.  Injuries to the skin.  Pregnancy.  Other illnesses. In some cases the cause may not be known. SYMPTOMS  The rash from EM shows up suddenly. The rash may appear days after the trigger. It may start as small, red, round or oval marks that become bumps or raised welts over 24 to 48 hours. These can spread and be quite large (about one inch [several centimeters]). These skin changes usually appear first on the backs of the hands, then spread to the tops of the feet, arms, elbows, knees, palms and soles. There may be a mild rash on the lips and lining of the mouth. The skin rash may show up in waves over a few days. There may be mild itching or burning of the skin at first. It may take up to 4 weeks to go away. The rash may come back again at a later time. DIAGNOSIS  Diagnosis of EM is usually made by physical exam. Sometimes a skin biopsy is done if the diagnosis is not certain. A skin biopsy is the removal of a small piece of tissue which can be examined under a microscope by a specialist (pathologist). TREATMENT  Most episodes of EM heal on their own and treatment may not be needed. If possible, it is best to remove the trigger or treat the infection. If your trigger is a herpes virus infection (cold sore), use sunscreen lotion and sunscreen-containing lip balm to prevent sunlight triggered outbreaks of  herpes virus. Medicine for itching may be given. Medicines can be used for severe cases and to prevent repeat bouts of EM.  HOME CARE INSTRUCTIONS   If possible, avoid known triggers.  If a medicine was your trigger, be sure to notify all of your caregivers. You should avoid this medicine or any like it in the future. SEEK MEDICAL CARE IF:   Your EM rash shows up again in the future SEEK IMMEDIATE MEDICAL CARE IF:   Red, swollen lips or mouth develop.  Burning feeling in the mouth or lips.  Blisters or open sores in the mouth, lips, vagina, penis or anus.  Eye pain, redness or drainage.  Blisters on the skin.  Difficulty breathing.  Difficulty swallowing; drooling.  Blood in urine.  Pain with urinating. Document Released: 11/27/2005 Document Revised: 02/19/2012 Document Reviewed: 11/13/2008 [facility] Patient Information 2015 [place], [place]. This information is not intended to replace advice given to you by your health care provider. Make sure you discuss any questions you have with your health care provider.

## 2015-05-12 NOTE — ED Notes (Signed)
Presents with swelling and sore throat   Having a hard time swallowing   Was seen 2 days ago with same  States she is worse now. Unable to eat/drink and she is 2 mos preg

## 2015-06-03 ENCOUNTER — Encounter: Payer: Self-pay | Admitting: *Deleted

## 2015-06-03 ENCOUNTER — Emergency Department
Admission: EM | Admit: 2015-06-03 | Discharge: 2015-06-04 | Disposition: A | Discharge status: Home / Self Care | Payer: Medicaid Other | Attending: Emergency Medicine | Admitting: Emergency Medicine

## 2015-06-03 DIAGNOSIS — Z880 Allergy status to penicillin: Secondary | ICD-10-CM | POA: Diagnosis not present

## 2015-06-03 DIAGNOSIS — Z3A09 9 weeks gestation of pregnancy: Secondary | ICD-10-CM | POA: Insufficient documentation

## 2015-06-03 DIAGNOSIS — O2341 Unspecified infection of urinary tract in pregnancy, first trimester: Secondary | ICD-10-CM

## 2015-06-03 DIAGNOSIS — Z79899 Other long term (current) drug therapy: Secondary | ICD-10-CM | POA: Diagnosis not present

## 2015-06-03 DIAGNOSIS — O9989 Other specified diseases and conditions complicating pregnancy, childbirth and the puerperium: Secondary | ICD-10-CM | POA: Diagnosis present

## 2015-06-03 LAB — URINALYSIS COMPLETE WITH MICROSCOPIC (ARMC ONLY)
Bilirubin Urine: NEGATIVE
GLUCOSE, UA: NEGATIVE mg/dL
Ketones, ur: NEGATIVE mg/dL
NITRITE: NEGATIVE
PROTEIN: NEGATIVE mg/dL
Specific Gravity, Urine: 1.016 (ref 1.005–1.030)
pH: 5 (ref 5.0–8.0)

## 2015-06-03 NOTE — ED Notes (Signed)
Pt is [redacted] weeks pregnant.  edc 01-01-16. Pt has urinary frequency and low back pain.   No abd pain.   No vag bleeding.  No vaginal discharge.  Pt has nausea.  [id number] (pt has twins)

## 2015-06-04 MED ORDER — NITROFURANTOIN MONOHYD MACRO 100 MG PO CAPS: 100.0000 mg | ORAL_CAPSULE | Freq: Two times a day (BID) | ORAL | Status: DC

## 2015-06-04 MED ORDER — NITROFURANTOIN MONOHYD MACRO 100 MG PO CAPS
100.0000 mg | ORAL_CAPSULE | Freq: Once | ORAL | Status: AC
  Administered 2015-06-04: 100 mg via ORAL

## 2015-06-04 MED ORDER — NITROFURANTOIN MONOHYD MACRO 100 MG PO CAPS
ORAL_CAPSULE | ORAL | Status: AC
  Administered 2015-06-04: 100 mg via ORAL
  Filled 2015-06-04: qty 1

## 2015-06-04 NOTE — ED Provider Notes (Signed)
[facility] Emergency Department Provider Note  ____________________________________________  Time seen: Approximately 12:02 AM  I have reviewed the triage vital signs and the nursing notes.   HISTORY  Chief Complaint Urinary Frequency    HPI [name] is a 29 y.o. female G3 P2 Ab1 approximately [redacted] weeks pregnant with twins who presents with a 2 day history of urinary frequency and low back pain. Patient denies fever, chills, vomiting, abdominal pain, vaginal bleeding, vaginal discharge, cough, chest pain, shortness of breath, headache, weakness, numbness, tingling. Last sexual intercourse over one week ago. Patient is seen by high risk OB at [facility] recent oral infection.   No past medical history on file.  There are no active problems to display for this patient.   Past Surgical History  Procedure Laterality Date  . Cesarean section      Current Outpatient Rx  Name  Route  Sig  Dispense  Refill  . ondansetron (ZOFRAN) 4 MG tablet   Oral   Take 4 mg by mouth every 8 (eight) hours as needed for nausea or vomiting.         . Doxylamine-Pyridoxine 10-10 MG TBEC   Oral   Take 2 tablets by mouth at bedtime. Patient not taking: Reported on 05/12/2015   60 tablet   0   . lidocaine (XYLOCAINE) 2 % solution   Mouth/Throat   Use as directed 20 mLs in the mouth or throat as needed for mouth pain.   100 mL   0   . nitrofurantoin, macrocrystal-monohydrate, (MACROBID) 100 MG capsule   Oral   Take 1 capsule (100 mg total) by mouth 2 (two) times daily.   14 capsule   0   . nystatin (MYCOSTATIN) 100000 UNIT/ML suspension   Oral   Take 5 mLs (500,000 Units total) by mouth 4 (four) times daily.   240 mL   0   . oxyCODONE (ROXICODONE) 5 MG/5ML solution   Oral   Take 5 mLs (5 mg total) by mouth every 4 (four) hours as needed for severe pain. Patient not taking: Reported on 05/12/2015   100 mL   0   . predniSONE (DELTASONE) 10 MG tablet    Oral   Take 1 tablet (10 mg total) by mouth daily. Take 60mg  by mouth on day one, decrease by 10mg  per day until gone   21 tablet   0     Allergies Tomato; Ibuprofen; and Penicillins  No family history on file.  Social History History  Substance Use Topics  . Smoking status: Never Smoker   . Smokeless tobacco: Never Used  . Alcohol Use: No    Review of Systems Constitutional: No fever/chills Eyes: No visual changes. ENT: No sore throat. Cardiovascular: Denies chest pain. Respiratory: Denies shortness of breath. Gastrointestinal: No abdominal pain.  No nausea, no vomiting.  No diarrhea.  No constipation. Genitourinary: Positive for urinary frequency.  Musculoskeletal: Positive for low back pain. Skin: Negative for rash. Neurological: Negative for headaches, focal weakness or numbness.  10-point ROS otherwise negative.  ____________________________________________   PHYSICAL EXAM:  VITAL SIGNS: ED Triage Vitals  Enc Vitals Group     BP 06/03/15 2139 117/72 mmHg     Pulse Rate 06/03/15 2139 101     Resp 06/03/15 2139 20     Temp 06/03/15 2139 98.7 F (37.1 C)     Temp Source 06/03/15 2139 Oral     SpO2 06/03/15 2139 99 %     Weight  06/03/15 2139 195 lb (88.451 kg)     Height 06/03/15 2139 5\' 4"  (1.626 m)     Head Cir --      Peak Flow --      Pain Score 06/03/15 2141 5     Pain Loc --      Pain Edu? --      Excl. in [place]? --     Constitutional: Alert and oriented. Well appearing and in no acute distress. Eyes: Conjunctivae are normal. PERRL. EOMI. Head: Atraumatic. Nose: No congestion/rhinnorhea. Mouth/Throat: Mucous membranes are moist.  Oropharynx non-erythematous. Neck: No stridor.   Cardiovascular: Normal rate, regular rhythm. Grossly normal heart sounds.  Good peripheral circulation. Respiratory: Normal respiratory effort.  No retractions. Lungs CTAB. Gastrointestinal: Soft and nontender. No distention. No abdominal bruits. No CVA  tenderness. Musculoskeletal: No lower extremity tenderness nor edema.  No joint effusions. Neurologic:  Normal speech and language. No gross focal neurologic deficits are appreciated. Speech is normal. No gait instability. Skin:  Skin is warm, dry and intact. No rash noted. Psychiatric: Mood and affect are normal. Speech and behavior are normal.  ____________________________________________   LABS (all labs ordered are listed, but only abnormal results are displayed)  Labs Reviewed  URINALYSIS COMPLETEWITH MICROSCOPIC ([facility] ONLY) - Abnormal; Notable for the following:    Color, Urine YELLOW (*)    APPearance HAZY (*)    Hgb urine dipstick 2+ (*)    Leukocytes, UA 3+ (*)    Bacteria, UA FEW (*)    Squamous Epithelial / LPF 0-5 (*)    All other components within normal limits  URINE CULTURE   ____________________________________________  EKG  None ____________________________________________  RADIOLOGY  None ____________________________________________   PROCEDURES  Procedure(s) performed: None  Critical Care performed: No  ____________________________________________   INITIAL IMPRESSION / ASSESSMENT AND PLAN / ED COURSE  Pertinent labs & imaging results that were available during my care of the patient were reviewed by me and considered in my medical decision making (see chart for details).  29 year old female approximately [redacted] weeks pregnant with twins presents for urinary frequency. Urinalysis notable for 3+ leukocytes with too numerous to count white blood cells. Patient declines IV or IM antibiotics while in the emergency department. Will start patient on Macrobid and follow-up with her OB early next week. Strict return precautions given. Patient verbalizes understanding and agrees with plan of care. ____________________________________________   FINAL CLINICAL IMPRESSION(S) / ED DIAGNOSES  Final diagnoses:  Urinary tract infection during pregnancy, first  trimester      Paulette Blanch, MD 06/04/15 [phone]

## 2015-06-04 NOTE — Discharge Instructions (Signed)
1. Take antibiotics as prescribed (Macrobid 100 mg twice daily 7 days). 2. Urine culture is pending. You will be notified if we need to change your antibiotic. 3. Return to the ER for worsening symptoms, fever, persistent vomiting, vaginal bleeding or other concerns.  Pregnancy and Urinary Tract Infection A urinary tract infection (UTI) is a bacterial infection of the urinary tract. Infection of the urinary tract can include the ureters, kidneys (pyelonephritis), bladder (cystitis), and urethra (urethritis). All pregnant women should be screened for bacteria in the urinary tract. Identifying and treating a UTI will decrease the risk of preterm labor and developing more serious infections in both the mother and baby. CAUSES Bacteria germs cause almost all UTIs.  RISK FACTORS Many factors can increase your chances of getting a UTI during pregnancy. These include:  Having a short urethra.  Poor toilet and hygiene habits.  Sexual intercourse.  Blockage of urine along the urinary tract.  Problems with the pelvic muscles or nerves.  Diabetes.  Obesity.  Bladder problems after having several children.  Previous history of UTI. SIGNS AND SYMPTOMS   Pain, burning, or a stinging feeling when urinating.  Suddenly feeling the need to urinate right away (urgency).  Loss of bladder control (urinary incontinence).  Frequent urination, more than is common with pregnancy.  Lower abdominal or back discomfort.  Cloudy urine.  Blood in the urine (hematuria).  Fever. When the kidneys are infected, the symptoms may be:  Back pain.  Flank pain on the right side more so than the left.  Fever.  Chills.  Nausea.  Vomiting. DIAGNOSIS  A urinary tract infection is usually diagnosed through urine tests. Additional tests and procedures are sometimes done. These may include:  Ultrasound exam of the kidneys, ureters, bladder, and urethra.  Looking in the bladder with a lighted tube  (cystoscopy). TREATMENT Typically, UTIs can be treated with antibiotic medicines.  HOME CARE INSTRUCTIONS   Only take over-the-counter or prescription medicines as directed by your health care provider. If you were prescribed antibiotics, take them as directed. Finish them even if you start to feel better.  Drink enough fluids to keep your urine clear or pale yellow.  Do not have sexual intercourse until the infection is gone and your health care provider says it is okay.  Make sure you are tested for UTIs throughout your pregnancy. These infections often come back. Preventing a UTI in the Future  Practice good toilet habits. Always wipe from front to back. Use the tissue only once.  Do not hold your urine. Empty your bladder as soon as possible when the urge comes.  Do not douche or use deodorant sprays.  Wash with soap and warm water around the genital area and the anus.  Empty your bladder before and after sexual intercourse.  Wear underwear with a cotton crotch.  Avoid caffeine and carbonated drinks. They can irritate the bladder.  Drink cranberry juice or take cranberry pills. This may decrease the risk of getting a UTI.  Do not drink alcohol.  Keep all your appointments and tests as scheduled. SEEK MEDICAL CARE IF:   Your symptoms get worse.  You are still having fevers 2 or more days after treatment begins.  You have a rash.  You feel that you are having problems with medicines prescribed.  You have abnormal vaginal discharge. SEEK IMMEDIATE MEDICAL CARE IF:   You have back or flank pain.  You have chills.  You have blood in your urine.  You have  nausea and vomiting.  You have contractions of your uterus.  You have a gush of fluid from the vagina. MAKE SURE YOU:  Understand these instructions.   Will watch your condition.   Will get help right away if you are not doing well or get worse.  Document Released: 03/24/2011 Document Revised:  09/17/2013 Document Reviewed: 06/26/2013 [facility] Patient Information 2015 [place], [place]. This information is not intended to replace advice given to you by your health care provider. Make sure you discuss any questions you have with your health care provider.

## 2015-06-06 LAB — URINE CULTURE

## 2015-07-31 ENCOUNTER — Emergency Department: Payer: Medicaid Other

## 2015-07-31 ENCOUNTER — Emergency Department
Admission: EM | Admit: 2015-07-31 | Discharge: 2015-07-31 | Disposition: A | Discharge status: Home / Self Care | Payer: Medicaid Other | Attending: Emergency Medicine | Admitting: Emergency Medicine

## 2015-07-31 ENCOUNTER — Encounter: Payer: Self-pay | Admitting: Emergency Medicine

## 2015-07-31 DIAGNOSIS — M5441 Lumbago with sciatica, right side: Secondary | ICD-10-CM | POA: Diagnosis not present

## 2015-07-31 DIAGNOSIS — O98812 Other maternal infectious and parasitic diseases complicating pregnancy, second trimester: Secondary | ICD-10-CM | POA: Insufficient documentation

## 2015-07-31 DIAGNOSIS — Z79899 Other long term (current) drug therapy: Secondary | ICD-10-CM | POA: Diagnosis not present

## 2015-07-31 DIAGNOSIS — O219 Vomiting of pregnancy, unspecified: Secondary | ICD-10-CM | POA: Diagnosis present

## 2015-07-31 DIAGNOSIS — O9989 Other specified diseases and conditions complicating pregnancy, childbirth and the puerperium: Secondary | ICD-10-CM | POA: Diagnosis not present

## 2015-07-31 DIAGNOSIS — Z880 Allergy status to penicillin: Secondary | ICD-10-CM | POA: Diagnosis not present

## 2015-07-31 DIAGNOSIS — Z3A18 18 weeks gestation of pregnancy: Secondary | ICD-10-CM | POA: Diagnosis not present

## 2015-07-31 DIAGNOSIS — A084 Viral intestinal infection, unspecified: Secondary | ICD-10-CM | POA: Diagnosis not present

## 2015-07-31 LAB — URINALYSIS COMPLETE WITH MICROSCOPIC (ARMC ONLY)
Bilirubin Urine: NEGATIVE
GLUCOSE, UA: NEGATIVE mg/dL
Hgb urine dipstick: NEGATIVE
KETONES UR: NEGATIVE mg/dL
Nitrite: NEGATIVE
Protein, ur: NEGATIVE mg/dL
Specific Gravity, Urine: 1.017 (ref 1.005–1.030)
pH: 6 (ref 5.0–8.0)

## 2015-07-31 LAB — COMPREHENSIVE METABOLIC PANEL
ALT: 18 U/L (ref 14–54)
AST: 32 U/L (ref 15–41)
Albumin: 3.3 g/dL — ABNORMAL LOW (ref 3.5–5.0)
Alkaline Phosphatase: 51 U/L (ref 38–126)
Anion gap: 6 (ref 5–15)
CO2: 19 mmol/L — ABNORMAL LOW (ref 22–32)
CREATININE: 0.68 mg/dL (ref 0.44–1.00)
Calcium: 8.7 mg/dL — ABNORMAL LOW (ref 8.9–10.3)
Chloride: 109 mmol/L (ref 101–111)
Glucose, Bld: 101 mg/dL — ABNORMAL HIGH (ref 65–99)
Potassium: 3.2 mmol/L — ABNORMAL LOW (ref 3.5–5.1)
Sodium: 134 mmol/L — ABNORMAL LOW (ref 135–145)
TOTAL PROTEIN: 6.2 g/dL — AB (ref 6.5–8.1)
Total Bilirubin: 0.3 mg/dL (ref 0.3–1.2)

## 2015-07-31 LAB — CBC WITH DIFFERENTIAL/PLATELET
Basophils Absolute: 0 10*3/uL (ref 0–0.1)
Basophils Relative: 1 %
EOS PCT: 2 %
Eosinophils Absolute: 0.1 10*3/uL (ref 0–0.7)
HCT: 38.1 % (ref 35.0–47.0)
Hemoglobin: 12.9 g/dL (ref 12.0–16.0)
LYMPHS ABS: 1.9 10*3/uL (ref 1.0–3.6)
LYMPHS PCT: 30 %
MCH: 31.1 pg (ref 26.0–34.0)
MCHC: 33.9 g/dL (ref 32.0–36.0)
MCV: 91.6 fL (ref 80.0–100.0)
MONO ABS: 0.7 10*3/uL (ref 0.2–0.9)
Monocytes Relative: 11 %
Neutro Abs: 3.6 10*3/uL (ref 1.4–6.5)
Neutrophils Relative %: 56 %
Platelets: 267 10*3/uL (ref 150–440)
RBC: 4.16 MIL/uL (ref 3.80–5.20)
RDW: 14.4 % (ref 11.5–14.5)
WBC: 6.3 10*3/uL (ref 3.6–11.0)

## 2015-07-31 LAB — HCG, QUANTITATIVE, PREGNANCY: hCG, Beta Chain, Quant, S: 5883 m[IU]/mL — ABNORMAL HIGH (ref <5)

## 2015-07-31 LAB — LIPASE, BLOOD: LIPASE: 15 U/L — AB (ref 22–51)

## 2015-07-31 MED ORDER — ONDANSETRON HCL 4 MG/2ML IJ SOLN
4.0000 mg | INTRAMUSCULAR | Status: AC
  Administered 2015-07-31: 4 mg via INTRAVENOUS
  Filled 2015-07-31: qty 2

## 2015-07-31 MED ORDER — ONDANSETRON HCL 4 MG PO TABS: ORAL_TABLET | ORAL | Status: DC

## 2015-07-31 MED ORDER — POTASSIUM CHLORIDE CRYS ER 20 MEQ PO TBCR
40.0000 meq | EXTENDED_RELEASE_TABLET | Freq: Once | ORAL | Status: AC
  Administered 2015-07-31: 40 meq via ORAL
  Filled 2015-07-31: qty 2

## 2015-07-31 MED ORDER — SODIUM CHLORIDE 0.9 % IV BOLUS (SEPSIS)
1000.0000 mL | INTRAVENOUS | Status: AC
  Administered 2015-07-31: 1000 mL via INTRAVENOUS

## 2015-07-31 NOTE — ED Notes (Signed)
Pt states she feels dehydrated, will address with [place].

## 2015-07-31 NOTE — ED Notes (Signed)
Pt. Called family for ride home.

## 2015-07-31 NOTE — ED Notes (Signed)
PO challenge performed. Pt tolerated well.

## 2015-07-31 NOTE — ED Notes (Signed)
Patient is resting comfortably. 

## 2015-07-31 NOTE — ED Provider Notes (Signed)
[facility] Emergency Department Provider Note  ____________________________________________  Time seen: Approximately 6:03 PM  I have reviewed the triage vital signs and the nursing notes.   HISTORY  Chief Complaint Emesis    HPI [name] is a 29 y.o. female G3 P2 at approximately 18 weeks who gets her prenatal care at [facility] who presents with nausea, vomiting, and diarrhea since yesterday.  She is also having several days of right-sided low back pain that extends into her buttocks.  She notes that the nausea, vomiting, diarrhea began after exposure to her little cousin who had a GI illness.  The symptoms of the same.  She has had multiple episodes of vomiting and diarrhea but without any blood in either of them and she is able to eat and drink something.  She is not having any abdominal pain and no pelvic pain or cramping.  The pain in her right lower back which extends into the right buttock is new for her and she did not experience that her other pregnancies.  It does seem to be worsened with movement and by pressure.  It is in the back side and not in the front.  She does not have any weakness or numbness in her extremity.  It was gradual in onset, constant but waxes and wanes in severity, and mild to moderate in severity.  She is also complaining of some itching in her groin and vagina similar to prior yeast infections.   History reviewed. No pertinent past medical history.  There are no active problems to display for this patient.   Past Surgical History  Procedure Laterality Date  . Cesarean section      Current Outpatient Rx  Name  Route  Sig  Dispense  Refill  . Doxylamine-Pyridoxine 10-10 MG TBEC   Oral   Take 2 tablets by mouth at bedtime. Patient not taking: Reported on 05/12/2015   60 tablet   0   . lidocaine (XYLOCAINE) 2 % solution   Mouth/Throat   Use as directed 20 mLs in the mouth or throat as needed for mouth pain.   100  mL   0   . nitrofurantoin, macrocrystal-monohydrate, (MACROBID) 100 MG capsule   Oral   Take 1 capsule (100 mg total) by mouth 2 (two) times daily.   14 capsule   0   . nystatin (MYCOSTATIN) 100000 UNIT/ML suspension   Oral   Take 5 mLs (500,000 Units total) by mouth 4 (four) times daily.   240 mL   0   . ondansetron (ZOFRAN) 4 MG tablet      Take 1-2 tabs by mouth every 8 hours as needed for nausea/vomiting   30 tablet   0   . oxyCODONE (ROXICODONE) 5 MG/5ML solution   Oral   Take 5 mLs (5 mg total) by mouth every 4 (four) hours as needed for severe pain. Patient not taking: Reported on 05/12/2015   100 mL   0   . predniSONE (DELTASONE) 10 MG tablet   Oral   Take 1 tablet (10 mg total) by mouth daily. Take 60mg  by mouth on day one, decrease by 10mg  per day until gone   21 tablet   0     Allergies Tomato; Ibuprofen; and Penicillins  No family history on file.  Social History Social History  Substance Use Topics  . Smoking status: Never Smoker   . Smokeless tobacco: Never Used  . Alcohol Use: No    Review  of Systems Constitutional: No fever/chills Eyes: No visual changes. ENT: No sore throat. Cardiovascular: Denies chest pain. Respiratory: Denies shortness of breath. Gastrointestinal: No abdominal pain.  Multiple episodes of nausea, vomiting, diarrhea.  No constipation. Genitourinary: Negative for dysuria. Musculoskeletal: right-sided lower back pain extending into the right buttock Skin: Negative for rash. Neurological: Negative for headaches, focal weakness or numbness.  10-point ROS otherwise negative.  ____________________________________________   PHYSICAL EXAM:  VITAL SIGNS: ED Triage Vitals  Enc Vitals Group     BP 07/31/15 1458 101/58 mmHg     Pulse Rate 07/31/15 1458 99     Resp 07/31/15 1458 20     Temp 07/31/15 1458 98.2 F (36.8 C)     Temp Source 07/31/15 1458 Oral     SpO2 07/31/15 1458 98 %     Weight 07/31/15 1458 213 lb  (96.616 kg)     Height 07/31/15 1458 5\' 4"  (1.626 m)     Head Cir --      Peak Flow --      Pain Score 07/31/15 1459 5     Pain Loc --      Pain Edu? --      Excl. in [place]? --     Constitutional: Alert and oriented. Well appearing and in no acute distress. Eyes: Conjunctivae are normal. PERRL. EOMI. Head: Atraumatic. Nose: No congestion/rhinnorhea. Mouth/Throat: Mucous membranes are moist.  Oropharynx non-erythematous. Neck: No stridor.   Cardiovascular: Normal rate, regular rhythm. Grossly normal heart sounds.  Good peripheral circulation. Respiratory: Normal respiratory effort.  No retractions. Lungs CTAB. Gastrointestinal: Soft and nontender. No distention. No abdominal bruits. No CVA tenderness. Musculoskeletal: No lower extremity tenderness nor edema.  No joint effusions. Neurologic:  Normal speech and language. No gross focal neurologic deficits are appreciated.  Skin:  Skin is warm, dry and intact. No rash noted. Psychiatric: Mood and affect are flat but appropriate. Speech and behavior are normal.  ____________________________________________   LABS (all labs ordered are listed, but only abnormal results are displayed)  Labs Reviewed  COMPREHENSIVE METABOLIC PANEL - Abnormal; Notable for the following:    Sodium 134 (*)    Potassium 3.2 (*)    CO2 19 (*)    Glucose, Bld 101 (*)    BUN <5 (*)    Calcium 8.7 (*)    Total Protein 6.2 (*)    Albumin 3.3 (*)    All other components within normal limits  LIPASE, BLOOD - Abnormal; Notable for the following:    Lipase 15 (*)    All other components within normal limits  URINALYSIS COMPLETEWITH MICROSCOPIC ([facility] ONLY) - Abnormal; Notable for the following:    Color, Urine YELLOW (*)    APPearance HAZY (*)    Leukocytes, UA TRACE (*)    Bacteria, UA RARE (*)    Squamous Epithelial / LPF 6-30 (*)    All other components within normal limits  HCG, QUANTITATIVE, PREGNANCY - Abnormal; Notable for the following:    hCG,  Beta Chain, Quant, S 5883 (*)    All other components within normal limits  CBC WITH DIFFERENTIAL/PLATELET   ____________________________________________  EKG  Not indicated ____________________________________________  RADIOLOGY   US Ob Limited (>[redacted] Weeks Gestation)  07/31/2015   CLINICAL DATA:  29 year old G3 P2, LMP 03/27/2015 (18 weeks 0 days), presenting with right lower quadrant abdominal pain, vomiting and decreased fetal movement. Quantitative beta HCG 5,883.  EXAM: LIMITED OBSTETRIC ULTRASOUND  FINDINGS: Number of Fetuses: 1  Heart Rate:  150 bpm  Movement: Visualized.  Presentation: Cephalic.  Placental Location: Posterior.  Previa: Low-lying without evidence of previa. Approximately 1.9 cm from the endocervix.  Amniotic Fluid (Subjective): Within normal limits. Largest measurable pocket 4.3 cm.  BPD:  4.0cm 18w  1d  MATERNAL FINDINGS:  Cervix:  Closed.  Approximately 4.9 cm in length.  Uterus/Adnexae:  No abnormality visualized.  IMPRESSION: 1. Single live intrauterine fetus currently in cephalic presentation. Estimated gestational age by BPD 18 weeks 1 day, correlating very well with the estimated gestational age by LMP of 18 weeks 0 days. 2. Low-lying posterior placenta without evidence of previa. The placental edge is approximately 1.9 cm away from the endocervix.  This exam is performed on an emergent basis and does not comprehensively evaluate fetal size, dating, or anatomy; follow-up complete OB US should be considered if further fetal assessment is warranted.   Electronically Signed   By: Evangeline Dakin M.D.   On: 07/31/2015 19:36    ____________________________________________   PROCEDURES  Procedure(s) performed: None  Critical Care performed: No ____________________________________________   INITIAL IMPRESSION / ASSESSMENT AND PLAN / ED COURSE  Pertinent labs & imaging results that were available during my care of the patient were reviewed by me and considered in  my medical decision making (see chart for details).  The patient has normal vital signs, is afebrile, and is in no acute distress.  She has no abnormalities on physical exam.  Given that she is in her second trimester I deferred a genitourinary exam.  I looked up on up-to-date recommendations for treatment of yeast infection, and based on conflicting data and possible complications of the pregnancy, I explained to her I would think it is better for her to follow up with her GYN doctor to discuss treatment options.  Her right lower back pain extending to her buttock is due to sciatica.  I had a discussion about this and gave her my usual customary return precautions.  I also believe that her nausea vomiting and diarrhea results of a viral gastroenteritis.  She has no abdominal tenderness to palpation.  I obtained the OB ultrasound because she was also mentioning decreased fetal movement but the ultrasound was reassuring.  The patient is able to tolerate by mouth in the emergency department and states that she feels much better after 1 L of fluid.  I gave her my usual and customary return precautions about this as well.  ____________________________________________  FINAL CLINICAL IMPRESSION(S) / ED DIAGNOSES  Final diagnoses:  Viral gastroenteritis  Right-sided low back pain with right-sided sciatica      NEW MEDICATIONS STARTED DURING THIS VISIT:  New Prescriptions   ONDANSETRON (ZOFRAN) 4 MG TABLET    Take 1-2 tabs by mouth every 8 hours as needed for nausea/vomiting     Hinda Kehr, MD 07/31/15 2032

## 2015-07-31 NOTE — Discharge Instructions (Signed)
As we discussed, we believe that you were exposed to a virus which is causing your nausea, vomiting, diarrhea.  You should be better soon.  Please take the prescribed Zofran as needed for nausea and vomiting and drink plenty of clear fluids.  Follow-up with your OB doctor on Monday for a follow-up visit.  You can also discuss with her/him whether or not you need to be treated for a yeast infection.  You can also discuss your right-sided sciatica and other things that may be helpful To help improve the symptoms.  Return immediately to the emergency department if he develop new or worsening symptoms that concern you.   Viral Gastroenteritis Viral gastroenteritis is also known as stomach flu. This condition affects the stomach and intestinal tract. It can cause sudden diarrhea and vomiting. The illness typically lasts 3 to 8 days. Most people develop an immune response that eventually gets rid of the virus. While this natural response develops, the virus can make you quite ill. CAUSES  Many different viruses can cause gastroenteritis, such as rotavirus or noroviruses. You can catch one of these viruses by consuming contaminated food or water. You may also catch a virus by sharing utensils or other personal items with an infected person or by touching a contaminated surface. SYMPTOMS  The most common symptoms are diarrhea and vomiting. These problems can cause a severe loss of body fluids (dehydration) and a body salt (electrolyte) imbalance. Other symptoms may include:  Fever.  Headache.  Fatigue.  Abdominal pain. DIAGNOSIS  Your caregiver can usually diagnose viral gastroenteritis based on your symptoms and a physical exam. A stool sample may also be taken to test for the presence of viruses or other infections. TREATMENT  This illness typically goes away on its own. Treatments are aimed at rehydration. The most serious cases of viral gastroenteritis involve vomiting so severely that you are not  able to keep fluids down. In these cases, fluids must be given through an intravenous line (IV). HOME CARE INSTRUCTIONS   Drink enough fluids to keep your urine clear or pale yellow. Drink small amounts of fluids frequently and increase the amounts as tolerated.  Ask your caregiver for specific rehydration instructions.  Avoid:  Foods high in sugar.  Alcohol.  Carbonated drinks.  Tobacco.  Juice.  Caffeine drinks.  Extremely hot or cold fluids.  Fatty, greasy foods.  Too much intake of anything at one time.  Dairy products until 24 to 48 hours after diarrhea stops.  You may consume probiotics. Probiotics are active cultures of beneficial bacteria. They may lessen the amount and number of diarrheal stools in adults. Probiotics can be found in yogurt with active cultures and in supplements.  Wash your hands well to avoid spreading the virus.  Only take over-the-counter or prescription medicines for pain, discomfort, or fever as directed by your caregiver. Do not give aspirin to children. Antidiarrheal medicines are not recommended.  Ask your caregiver if you should continue to take your regular prescribed and over-the-counter medicines.  Keep all follow-up appointments as directed by your caregiver. SEEK IMMEDIATE MEDICAL CARE IF:   You are unable to keep fluids down.  You do not urinate at least once every 6 to 8 hours.  You develop shortness of breath.  You notice blood in your stool or vomit. This may look like coffee grounds.  You have abdominal pain that increases or is concentrated in one small area (localized).  You have persistent vomiting or diarrhea.  You have  a fever.  The patient is a child younger than 3 months, and he or she has a fever.  The patient is a child older than 3 months, and he or she has a fever and persistent symptoms.  The patient is a child older than 3 months, and he or she has a fever and symptoms suddenly get worse.  The  patient is a baby, and he or she has no tears when crying. MAKE SURE YOU:   Understand these instructions.  Will watch your condition.  Will get help right away if you are not doing well or get worse. Document Released: 11/27/2005 Document Revised: 02/19/2012 Document Reviewed: 09/13/2011 [facility] Patient Information 2015 [place], [place]. This information is not intended to replace advice given to you by your health care provider. Make sure you discuss any questions you have with your health care provider.  Sciatica Sciatica is pain, weakness, numbness, or tingling along your sciatic nerve. The nerve starts in the lower back and runs down the back of each leg. Nerve damage or certain conditions pinch or put pressure on the sciatic nerve. This causes the pain, weakness, and other discomforts of sciatica. HOME CARE   Only take medicine as told by your doctor.  Apply ice to the affected area for 20 minutes. Do this 3-4 times a day for the first 48-72 hours. Then try heat in the same way.  Exercise, stretch, or do your usual activities if these do not make your pain worse.  Go to physical therapy as told by your doctor.  Keep all doctor visits as told.  Do not wear high heels or shoes that are not supportive.  Get a firm mattress if your mattress is too soft to lessen pain and discomfort. GET HELP RIGHT AWAY IF:   You cannot control when you poop (bowel movement) or pee (urinate).  You have more weakness in your lower back, lower belly (pelvis), butt (buttocks), or legs.  You have redness or puffiness (swelling) of your back.  You have a burning feeling when you pee.  You have pain that gets worse when you lie down.  You have pain that wakes you from your sleep.  Your pain is worse than past pain.  Your pain lasts longer than 4 weeks.  You are suddenly losing weight without reason. MAKE SURE YOU:   Understand these instructions.  Will watch this condition.  Will get  help right away if you are not doing well or get worse. Document Released: 09/05/2008 Document Revised: 05/28/2012 Document Reviewed: 04/07/2012 [facility] Patient Information 2015 [place], [place]. This information is not intended to replace advice given to you by your health care provider. Make sure you discuss any questions you have with your health care provider.

## 2015-07-31 NOTE — ED Notes (Signed)
[redacted] weeks pregnant, states nausea and vomiting since yesterday, low back pain, yeast infection, lower abd pain, denies vaginal bleeding or feeling of contractions

## 2015-11-10 ENCOUNTER — Observation Stay
Admission: EM | Admit: 2015-11-10 | Discharge: 2015-11-10 | Disposition: A | Discharge status: Home / Self Care | Payer: Medicaid Other | Attending: Obstetrics & Gynecology | Admitting: Obstetrics & Gynecology

## 2015-11-10 ENCOUNTER — Encounter: Payer: Self-pay | Admitting: *Deleted

## 2015-11-10 DIAGNOSIS — M549 Dorsalgia, unspecified: Secondary | ICD-10-CM | POA: Diagnosis present

## 2015-11-10 DIAGNOSIS — O26899 Other specified pregnancy related conditions, unspecified trimester: Secondary | ICD-10-CM | POA: Insufficient documentation

## 2015-11-10 DIAGNOSIS — O479 False labor, unspecified: Secondary | ICD-10-CM | POA: Diagnosis present

## 2015-11-10 LAB — URINALYSIS COMPLETE WITH MICROSCOPIC (ARMC ONLY)
BILIRUBIN URINE: NEGATIVE
Glucose, UA: NEGATIVE mg/dL
Ketones, ur: NEGATIVE mg/dL
Nitrite: POSITIVE — AB
PH: 6 (ref 5.0–8.0)
Protein, ur: 100 mg/dL — AB
Specific Gravity, Urine: 1.03 (ref 1.005–1.030)

## 2015-11-10 MED ORDER — ONDANSETRON HCL 4 MG/2ML IJ SOLN: 4.0000 mg | Freq: Four times a day (QID) | INTRAMUSCULAR | Status: DC | PRN

## 2015-11-10 MED ORDER — NITROFURANTOIN MONOHYD MACRO 100 MG PO CAPS
100.0000 mg | ORAL_CAPSULE | Freq: Once | ORAL | Status: AC
  Administered 2015-11-10: 100 mg via ORAL
  Filled 2015-11-10: qty 1

## 2015-11-10 MED ORDER — ACETAMINOPHEN 325 MG PO TABS: 650.0000 mg | ORAL_TABLET | ORAL | Status: DC | PRN

## 2015-11-10 NOTE — Discharge Instructions (Signed)
Rx called into [facility] in [place]

## 2016-09-14 ENCOUNTER — Encounter (HOSPITAL_COMMUNITY): Payer: Self-pay

## 2016-12-15 ENCOUNTER — Emergency Department
Admission: EM | Admit: 2016-12-15 | Discharge: 2016-12-15 | Disposition: A | Discharge status: Home / Self Care | Payer: Medicaid Other | Attending: Emergency Medicine | Admitting: Emergency Medicine

## 2016-12-15 ENCOUNTER — Encounter: Payer: Self-pay | Admitting: Emergency Medicine

## 2016-12-15 DIAGNOSIS — B9789 Other viral agents as the cause of diseases classified elsewhere: Secondary | ICD-10-CM

## 2016-12-15 DIAGNOSIS — R509 Fever, unspecified: Secondary | ICD-10-CM | POA: Diagnosis present

## 2016-12-15 DIAGNOSIS — J989 Respiratory disorder, unspecified: Secondary | ICD-10-CM | POA: Insufficient documentation

## 2016-12-15 DIAGNOSIS — J988 Other specified respiratory disorders: Secondary | ICD-10-CM

## 2016-12-15 DIAGNOSIS — J111 Influenza due to unidentified influenza virus with other respiratory manifestations: Secondary | ICD-10-CM

## 2016-12-15 DIAGNOSIS — R69 Illness, unspecified: Secondary | ICD-10-CM

## 2016-12-15 LAB — RAPID INFLUENZA A&B ANTIGENS
Influenza A (ARMC): NEGATIVE
Influenza B (ARMC): NEGATIVE

## 2016-12-15 LAB — POCT RAPID STREP A: Streptococcus, Group A Screen (Direct): NEGATIVE

## 2016-12-15 MED ORDER — ONDANSETRON 4 MG PO TBDP
4.0000 mg | ORAL_TABLET | Freq: Once | ORAL | Status: AC
  Administered 2016-12-15: 4 mg via ORAL
  Filled 2016-12-15: qty 1

## 2016-12-15 MED ORDER — ACETAMINOPHEN 500 MG PO TABS
1000.0000 mg | ORAL_TABLET | Freq: Once | ORAL | Status: AC
  Administered 2016-12-15: 1000 mg via ORAL
  Filled 2016-12-15: qty 2

## 2016-12-15 MED ORDER — FLUTICASONE PROPIONATE 50 MCG/ACT NA SUSP: 2.0000 | Freq: Every day | NASAL | 0 refills | Status: DC

## 2016-12-15 MED ORDER — ACETAMINOPHEN-CODEINE #3 300-30 MG PO TABS: 1.0000 | ORAL_TABLET | Freq: Three times a day (TID) | ORAL | 0 refills | Status: DC | PRN

## 2016-12-15 MED ORDER — BENZONATATE 100 MG PO CAPS
200.0000 mg | ORAL_CAPSULE | Freq: Once | ORAL | Status: AC
  Administered 2016-12-15: 200 mg via ORAL
  Filled 2016-12-15: qty 2

## 2016-12-15 MED ORDER — BENZONATATE 100 MG PO CAPS: 100.0000 mg | ORAL_CAPSULE | Freq: Three times a day (TID) | ORAL | 0 refills | Status: DC | PRN

## 2016-12-15 NOTE — ED Triage Notes (Signed)
Patient c/o fever, sore throat, fatigue, nausea, and cough. Patient states that symptoms started on Tuesday, patient in NAD in triage, breathing is equal and unlabored, color WNL.

## 2016-12-15 NOTE — ED Notes (Signed)
See triage note  Developed diarrhea on Tuesday  Now having sore throat cough and fever    also having weakness

## 2016-12-15 NOTE — Discharge Instructions (Signed)
Your rapid strep test and flu test were negative. Take the prescription meds as directed. You may take OTC ibuprofen in addition to the Tylenol w/ codeine, for fevers and bodyaches. Increase fluid intake and follow-up with [facility] as needed.

## 2016-12-16 NOTE — ED Provider Notes (Signed)
[facility] Emergency Department Provider Note ____________________________________________  Time seen: 1110  I have reviewed the triage vital signs and the nursing notes.  HISTORY  Chief Complaint  Fever  HPI [name] is a 31 y.o. female presents to the ED with her25-month-old daughter, for evaluation of fever, sore throat, fatigue, nausea, and cough in herself. She notes the symptoms began on Tuesday and have been persistent since that time. She did not receive the seasonal flu vaccine. She has not dosed any medications for relief of symptoms, since onset.  History reviewed. No pertinent past medical history.  Patient Active Problem List   Diagnosis Date Noted  . Irregular contractions 11/10/2015    Past Surgical History:  Procedure Laterality Date  . CESAREAN SECTION      Prior to Admission medications   Medication Sig Start Date End Date Taking? Authorizing Provider  acetaminophen-codeine (TYLENOL #3) 300-30 MG tablet Take 1 tablet by mouth every 8 (eight) hours as needed for moderate pain. 12/15/16   Izel Hochberg V Bacon Mckensi Redinger, PA-C  benzonatate (TESSALON PERLES) 100 MG capsule Take 1 capsule (100 mg total) by mouth 3 (three) times daily as needed for cough (Take 1-2 per dose). 12/15/16   Tanvir Hipple V Bacon Maximilian Tallo, PA-C  Doxylamine-Pyridoxine 10-10 MG TBEC Take 2 tablets by mouth at bedtime. Patient not taking: Reported on 05/12/2015 05/03/15   Victorino Dike, FNP  fluticasone (FLONASE) 50 MCG/ACT nasal spray Place 2 sprays into both nostrils daily. 12/15/16   Mairen Wallenstein V Bacon Sallye Lunz, PA-C  lidocaine (XYLOCAINE) 2 % solution Use as directed 20 mLs in the mouth or throat as needed for mouth pain. Patient not taking: Reported on 11/10/2015 05/10/15   Harvest Dark, MD  nitrofurantoin, macrocrystal-monohydrate, (MACROBID) 100 MG capsule Take 1 capsule (100 mg total) by mouth 2 (two) times daily. Patient not taking: Reported on 11/10/2015 06/04/15   Paulette Blanch,  MD  nystatin (MYCOSTATIN) 100000 UNIT/ML suspension Take 5 mLs (500,000 Units total) by mouth 4 (four) times daily. Patient not taking: Reported on 11/10/2015 05/10/15   Harvest Dark, MD  ondansetron [facility]) 4 MG tablet Take 1-2 tabs by mouth every 8 hours as needed for nausea/vomiting Patient not taking: Reported on 11/10/2015 07/31/15   Hinda Kehr, MD  oxyCODONE (ROXICODONE) 5 MG/5ML solution Take 5 mLs (5 mg total) by mouth every 4 (four) hours as needed for severe pain. Patient not taking: Reported on 05/12/2015 05/10/15   Harvest Dark, MD  predniSONE (DELTASONE) 10 MG tablet Take 1 tablet (10 mg total) by mouth daily. Take 60mg  by mouth on day one, decrease by 10mg  per day until gone 05/12/15   Earleen Newport, MD    Allergies Tomato; Ibuprofen; and Penicillins  No family history on file.  Social History Social History  Substance Use Topics  . Smoking status: Never Smoker  . Smokeless tobacco: Never Used  . Alcohol use No    Review of Systems  Constitutional: Positive for fever. Eyes: Negative for visual changes. ENT: Positive for sore roof of mouth.. Cardiovascular: Negative for chest pain. Respiratory: Negative for shortness of breath. Gastrointestinal: Negative for abdominal pain, vomiting and diarrhea. Reports nausea. Genitourinary: Negative for dysuria. Musculoskeletal: Negative for back pain. Skin: Negative for rash. Neurological: Negative for headaches, focal weakness or numbness. ____________________________________________  PHYSICAL EXAM:  VITAL SIGNS: ED Triage Vitals  Enc Vitals Group     BP 12/15/16 1026 120/71     Pulse Rate 12/15/16 1026 (!) 133  Resp 12/15/16 1026 15     Temp 12/15/16 1026 (!) 100.5 F (38.1 C)     Temp Source 12/15/16 1026 Oral     SpO2 12/15/16 1026 98 %     Weight 12/15/16 1027 161 lb (73 kg)     Height 12/15/16 1027 5\' 4"  (1.626 m)     Head Circumference --      Peak Flow --      Pain Score 12/15/16 1038 8      Pain Loc --      Pain Edu? --      Excl. in [place]? --     Constitutional: Alert and oriented. Well appearing and in no distress. Head: Normocephalic and atraumatic. Eyes: Conjunctivae are normal. PERRL. Normal extraocular movements Ears: Canals clear. TMs intact bilaterally. Nose: No congestion/rhinorrhea/epistaxis. Mouth/Throat: Mucous membranes are moist. Uvula is midline and tonsils are flat. Patient with a small shallow aphthous ulcer to the soft palate on the left. Neck: Supple. No thyromegaly. Hematological/Lymphatic/Immunological: No cervical lymphadenopathy. Cardiovascular: Normal rate, regular rhythm. Normal distal pulses. Respiratory: Normal respiratory effort. No wheezes/rales/rhonchi. Gastrointestinal: Soft and nontender. No distention. ____________________________________________   LABS (pertinent positives/negatives) Labs Reviewed  RAPID INFLUENZA A&B ANTIGENS ([facility] ONLY)  POCT RAPID STREP A  ___________________________________________  PROCEDURES  Tylenol 1000 mg PO Tessalon Perles 200 mg PO Zofran 4 mg ODT ____________________________________________  INITIAL IMPRESSION / ASSESSMENT AND PLAN / ED COURSE  Patient with presentation likely represents a influenza despite a negative rapid flu swab. Patient is discharged with prescriptions for Tessalon Perles, Flonase, and Tylenol with Codeine for symptom relief. She is advised to follow-up with her primary care provider for ongoing symptom management. She should hydrate and rest as necessary.  Clinical Course    ____________________________________________  FINAL CLINICAL IMPRESSION(S) / ED DIAGNOSES  Final diagnoses:  Influenza-like illness  Viral respiratory illness      Melvenia Needles, PA-C 12/16/16 1637    Earleen Newport, MD 12/17/16 [phone]

## 2017-09-21 ENCOUNTER — Emergency Department
Admission: EM | Admit: 2017-09-21 | Discharge: 2017-09-21 | Disposition: A | Discharge status: Home / Self Care | Payer: Medicaid Other | Attending: Emergency Medicine | Admitting: Emergency Medicine

## 2017-09-21 ENCOUNTER — Encounter: Payer: Self-pay | Admitting: Emergency Medicine

## 2017-09-21 DIAGNOSIS — Z3201 Encounter for pregnancy test, result positive: Secondary | ICD-10-CM | POA: Diagnosis not present

## 2017-09-21 DIAGNOSIS — N912 Amenorrhea, unspecified: Secondary | ICD-10-CM | POA: Insufficient documentation

## 2017-09-21 LAB — URINALYSIS, COMPLETE (UACMP) WITH MICROSCOPIC
BILIRUBIN URINE: NEGATIVE
Glucose, UA: NEGATIVE mg/dL
Ketones, ur: 5 mg/dL — AB
LEUKOCYTES UA: NEGATIVE
NITRITE: NEGATIVE
PROTEIN: 30 mg/dL — AB
SPECIFIC GRAVITY, URINE: 1.03 (ref 1.005–1.030)
pH: 5 (ref 5.0–8.0)

## 2017-09-21 LAB — HCG, QUANTITATIVE, PREGNANCY: hCG, Beta Chain, Quant, S: 78243 m[IU]/mL — ABNORMAL HIGH (ref <5)

## 2017-09-21 LAB — POCT PREGNANCY, URINE: PREG TEST UR: POSITIVE — AB

## 2017-09-21 MED ORDER — METOCLOPRAMIDE HCL 5 MG PO TABS: 5.0000 mg | ORAL_TABLET | Freq: Three times a day (TID) | ORAL | 0 refills | Status: DC | PRN

## 2017-09-21 NOTE — ED Provider Notes (Signed)
[facility] Emergency Department Provider Note ____________________________________________  Time seen: 1701  I have reviewed the triage vital signs and the nursing notes.  HISTORY  Chief Complaint  Possible Pregnancy  HPI [name] is a 31 y.o. female ([id number]) Presents to the ED for evaluation of pregnancy. She describes having had a miscarriage in July. She describes having a menstrual period in August, but did not have one for the month of September. She presents today because she began having some urinary frequency, nausea without vomiting, food aversion, and breast tenderness. She took a home pregnancy test yesterday, which was positive. She presents now for confirmation of pregnancy status and evaluation for possible UTI.She denies vaginal bleeding or pelvic pain.   History reviewed. No pertinent past medical history.  Patient Active Problem List   Diagnosis Date Noted  . Irregular contractions 11/10/2015    Past Surgical History:  Procedure Laterality Date  . CESAREAN SECTION      Prior to Admission medications   Medication Sig Start Date End Date Taking? Authorizing Provider  acetaminophen-codeine (TYLENOL #3) 300-30 MG tablet Take 1 tablet by mouth every 8 (eight) hours as needed for moderate pain. 12/15/16   Tian Davison, Dannielle Karvonen, PA-C  benzonatate (TESSALON PERLES) 100 MG capsule Take 1 capsule (100 mg total) by mouth 3 (three) times daily as needed for cough (Take 1-2 per dose). 12/15/16   Jenesis Martin, Dannielle Karvonen, PA-C  Doxylamine-Pyridoxine 10-10 MG TBEC Take 2 tablets by mouth at bedtime. Patient not taking: Reported on 05/12/2015 05/03/15   Sherrie George B, FNP  fluticasone (FLONASE) 50 MCG/ACT nasal spray Place 2 sprays into both nostrils daily. 12/15/16   Anika Shore, Dannielle Karvonen, PA-C  lidocaine (XYLOCAINE) 2 % solution Use as directed 20 mLs in the mouth or throat as needed for mouth pain. Patient not taking: Reported on 11/10/2015 05/10/15    Harvest Dark, MD  metoCLOPramide (REGLAN) 5 MG tablet Take 1 tablet (5 mg total) by mouth every 8 (eight) hours as needed for nausea or vomiting. 09/21/17   Noelie Renfrow, Dannielle Karvonen, PA-C  nitrofurantoin, macrocrystal-monohydrate, (MACROBID) 100 MG capsule Take 1 capsule (100 mg total) by mouth 2 (two) times daily. Patient not taking: Reported on 11/10/2015 06/04/15   Paulette Blanch, MD  nystatin (MYCOSTATIN) 100000 UNIT/ML suspension Take 5 mLs (500,000 Units total) by mouth 4 (four) times daily. Patient not taking: Reported on 11/10/2015 05/10/15   Harvest Dark, MD  ondansetron [facility]) 4 MG tablet Take 1-2 tabs by mouth every 8 hours as needed for nausea/vomiting Patient not taking: Reported on 11/10/2015 07/31/15   Hinda Kehr, MD  oxyCODONE (ROXICODONE) 5 MG/5ML solution Take 5 mLs (5 mg total) by mouth every 4 (four) hours as needed for severe pain. Patient not taking: Reported on 05/12/2015 05/10/15   Harvest Dark, MD  predniSONE (DELTASONE) 10 MG tablet Take 1 tablet (10 mg total) by mouth daily. Take 60mg  by mouth on day one, decrease by 10mg  per day until gone 05/12/15   Earleen Newport, MD    Allergies Tomato; Ibuprofen; and Penicillins  No family history on file.  Social History Social History  Substance Use Topics  . Smoking status: Never Smoker  . Smokeless tobacco: Never Used  . Alcohol use No    Review of Systems  Constitutional: Negative for fever. Eyes: Negative for visual changes. ENT: Negative for sore throat. Cardiovascular: Negative for chest pain. Respiratory: Negative for shortness of breath. Gastrointestinal: Negative for abdominal pain,  vomiting and diarrhea. Genitourinary: Negative for dysuria. Musculoskeletal: Negative for back pain. Skin: Negative for rash. Neurological: Negative for headaches, focal weakness or numbness. ____________________________________________  PHYSICAL EXAM:  VITAL SIGNS: ED Triage Vitals  Enc Vitals Group      BP 09/21/17 1635 114/73     Pulse Rate 09/21/17 1634 71     Resp 09/21/17 1634 16     Temp 09/21/17 1634 97.8 F (36.6 C)     Temp Source 09/21/17 1634 Oral     SpO2 09/21/17 1634 100 %     Weight --      Height --      Head Circumference --      Peak Flow --      Pain Score --      Pain Loc --      Pain Edu? --      Excl. in [place]? --     Constitutional: Alert and oriented. Well appearing and in no distress. Head: Normocephalic and atraumatic. Eyes: Conjunctivae are normal. Normal extraocular movements Cardiovascular: Normal rate, regular rhythm. Normal distal pulses. Respiratory: Normal respiratory effort. No wheezes/rales/rhonchi. Gastrointestinal: Soft and nontender. No distention. Musculoskeletal: Nontender with normal range of motion in all extremities.  Neurologic:  Normal gait without ataxia. Normal speech and language. No gross focal neurologic deficits are appreciated. Skin:  Skin is warm, dry and intact. No rash noted. ____________________________________________   LABS (pertinent positives/negatives)  Labs Reviewed  HCG, QUANTITATIVE, PREGNANCY - Abnormal; Notable for the following:       Result Value   hCG, Beta Chain, Quant, S 78,243 (*)    All other components within normal limits  URINALYSIS, COMPLETE (UACMP) WITH MICROSCOPIC - Abnormal; Notable for the following:    Color, Urine YELLOW (*)    APPearance HAZY (*)    Hgb urine dipstick SMALL (*)    Ketones, ur 5 (*)    Protein, ur 30 (*)    Bacteria, UA RARE (*)    Squamous Epithelial / LPF 0-5 (*)    All other components within normal limits  POCT PREGNANCY, URINE - Abnormal; Notable for the following:    Preg Test, Ur POSITIVE (*)    All other components within normal limits  POC URINE PREG, ED  ____________________________________________  INITIAL IMPRESSION / ASSESSMENT AND PLAN / ED COURSE  Patient with ED evaluation of amenorrhea and urinary frequency. She is found to have a positive urine  pregnancy test and a beta HCG that is elevated at >78K. She is discharged with a confirmed positive pregnancy and denies any abdominal/pelvic pain or abnormal vaginal bleeding. She is given a prescription for Reglan and referred to [facility] for routine prenatal care. Return precautions are reviewed.  ___________________________________________  FINAL CLINICAL IMPRESSION(S) / ED DIAGNOSES  Final diagnoses:  Amenorrhea  Encounter for pregnancy test, result positive      Carmie End, Dannielle Karvonen, PA-C 09/21/17 1859    Delman Kitten, MD 09/22/17 [phone]

## 2017-09-21 NOTE — ED Triage Notes (Signed)
Pt to ED stating that she had a positive at home pregnancy test yesterday. Pt states that she is wanting to confirm that she is pregnant. Pt denies any complaints at this time. No bleeding or cramping.

## 2017-09-21 NOTE — ED Notes (Signed)
Pt had pos preg test at home. Denies bleeding or abdominal pain. States she had an abortion in the past few months as well.

## 2017-09-21 NOTE — Discharge Instructions (Signed)
Your urine pregnancy test is positive. Your blood test will confirm the dates of your pregnancy. Start a prenatal vitamin and follow-up with [facility] for ongoing prenatal care. Take the nausea medicine as needed. Return to the ED for abnormal vaginal bleeding or abdominal/pelvic pain.

## 2017-09-24 ENCOUNTER — Telehealth: Payer: Self-pay | Admitting: Emergency Medicine

## 2017-09-24 NOTE — Telephone Encounter (Signed)
Pt called asking for results of blood test done on ed visit.  Explained the bhcg test. Discussed follow up plans.  She is going to call [name] drew today for appt.

## 2018-03-28 ENCOUNTER — Observation Stay
Admission: EM | Admit: 2018-03-28 | Discharge: 2018-03-28 | Disposition: A | Discharge status: Home / Self Care | Payer: Medicaid Other | Attending: Obstetrics and Gynecology | Admitting: Obstetrics and Gynecology

## 2018-03-28 DIAGNOSIS — M549 Dorsalgia, unspecified: Secondary | ICD-10-CM | POA: Insufficient documentation

## 2018-03-28 DIAGNOSIS — O26899 Other specified pregnancy related conditions, unspecified trimester: Secondary | ICD-10-CM | POA: Diagnosis not present

## 2018-03-28 DIAGNOSIS — O99891 Other specified diseases and conditions complicating pregnancy: Secondary | ICD-10-CM

## 2018-03-28 DIAGNOSIS — Z3A00 Weeks of gestation of pregnancy not specified: Secondary | ICD-10-CM | POA: Insufficient documentation

## 2018-03-28 DIAGNOSIS — O9989 Other specified diseases and conditions complicating pregnancy, childbirth and the puerperium: Secondary | ICD-10-CM

## 2018-03-28 HISTORY — DX: Other forms of stomatitis: K12.1

## 2018-03-28 NOTE — Progress Notes (Signed)
Provided and reviewed discharge paperwork to include information on fetal kick counts, reasons to return to hospital, follow up care, preterm labor information and back pain during pregnancy. Also provided letter for work. Pt verbalized understanding. Discharged ambulatory in no distress to go home.

## 2018-03-28 NOTE — Discharge Instructions (Signed)
Keep scheduled follow up appointment- Monday, April 01, 2018.

## 2018-03-28 NOTE — OB Triage Note (Signed)
G3P2 [redacted]w[redacted]d (per pt report) receiving care at [facility] presents to [facility] d/t low back pain that began last night and "got worse today," rated 8.5/10, accompanied by intermittent shooting pain in the low abdomen and groin areas. Denies vaginal bleeding, LOF and reports positive fetal movement.

## 2018-03-28 NOTE — ED Notes (Signed)
Pt transported to [place] via wheelchair.

## 2018-03-29 NOTE — Final Progress Note (Signed)
Physician Final Progress Note  Patient ID: [name] MRN: [id number] DOB/AGE: Nov 03, 1986 32 y.o.  Admit date: 03/28/2018 Admitting provider: Homero Fellers, MD Discharge date: 03/29/2018   Admission Diagnoses: Back pain in pregnancy  Discharge Diagnoses:  Active Problems:   Back pain affecting pregnancy   Hospital Course: Patient was admitted for back pain in pregnancy. She was evaluated and found to not be contracting. Sterile cervical exam by nursing was performed and the patient was found to have a cervix that was long and closed. Discussed with patient supportive measures for back pain in pregnancy.   Consults: None  Significant Findings/ Diagnostic Studies: none  Procedures: None  Discharge Condition: good  Disposition:   Diet: Regular diet  Discharge Activity: Activity as tolerated   Allergies as of 03/28/2018      Reactions   Tomato Swelling   Ibuprofen Other (See Comments)   Reaction:  Sores in mouth   Penicillins Other (See Comments)   Reaction:  Sores in mouth      Medication List    ASK your doctor about these medications   acetaminophen-codeine 300-30 MG tablet Commonly known as:  TYLENOL #3 Take 1 tablet by mouth every 8 (eight) hours as needed for moderate pain.   benzonatate 100 MG capsule Commonly known as:  TESSALON PERLES Take 1 capsule (100 mg total) by mouth 3 (three) times daily as needed for cough (Take 1-2 per dose).   Doxylamine-Pyridoxine 10-10 MG Tbec Take 2 tablets by mouth at bedtime.   fluticasone 50 MCG/ACT nasal spray Commonly known as:  FLONASE Place 2 sprays into both nostrils daily.   lidocaine 2 % solution Commonly known as:  XYLOCAINE Use as directed 20 mLs in the mouth or throat as needed for mouth pain.   metoCLOPramide 5 MG tablet Commonly known as:  REGLAN Take 1 tablet (5 mg total) by mouth every 8 (eight) hours as needed for nausea or vomiting.   nitrofurantoin (macrocrystal-monohydrate) 100 MG  capsule Commonly known as:  MACROBID Take 1 capsule (100 mg total) by mouth 2 (two) times daily.   nystatin 100000 UNIT/ML suspension Commonly known as:  MYCOSTATIN Take 5 mLs (500,000 Units total) by mouth 4 (four) times daily.   ondansetron 4 MG tablet Commonly known as:  ZOFRAN Take 1-2 tabs by mouth every 8 hours as needed for nausea/vomiting   oxyCODONE 5 MG/5ML solution Commonly known as:  ROXICODONE Take 5 mLs (5 mg total) by mouth every 4 (four) hours as needed for severe pain.   predniSONE 10 MG tablet Commonly known as:  DELTASONE Take 1 tablet (10 mg total) by mouth daily. Take 60mg  by mouth on day one, decrease by 10mg  per day until gone   prenatal multivitamin Tabs tablet Take 1 tablet by mouth daily at 12 noon.        Total time spent taking care of this patient: 10 minutes  Signed: Lliam Hoh R Fortune Torosian 03/29/2018, 3:48 PM

## 2018-03-29 NOTE — Discharge Summary (Signed)
Please see final progress note. Adrian Prows MD [facility], [facility] 03/29/18 4:16 PM

## 2019-01-14 ENCOUNTER — Emergency Department: Payer: Medicaid Other

## 2019-01-14 ENCOUNTER — Other Ambulatory Visit: Payer: Self-pay

## 2019-01-14 ENCOUNTER — Encounter: Payer: Self-pay | Admitting: Emergency Medicine

## 2019-01-14 ENCOUNTER — Emergency Department
Admission: EM | Admit: 2019-01-14 | Discharge: 2019-01-14 | Disposition: A | Discharge status: Home / Self Care | Payer: Medicaid Other | Attending: Student in an Organized Health Care Education/Training Program | Admitting: Student in an Organized Health Care Education/Training Program

## 2019-01-14 DIAGNOSIS — Z79899 Other long term (current) drug therapy: Secondary | ICD-10-CM | POA: Diagnosis not present

## 2019-01-14 DIAGNOSIS — J101 Influenza due to other identified influenza virus with other respiratory manifestations: Secondary | ICD-10-CM | POA: Diagnosis not present

## 2019-01-14 DIAGNOSIS — R079 Chest pain, unspecified: Secondary | ICD-10-CM | POA: Diagnosis present

## 2019-01-14 DIAGNOSIS — J40 Bronchitis, not specified as acute or chronic: Secondary | ICD-10-CM | POA: Insufficient documentation

## 2019-01-14 LAB — BASIC METABOLIC PANEL
Anion gap: 6 (ref 5–15)
BUN: 13 mg/dL (ref 6–20)
CHLORIDE: 108 mmol/L (ref 98–111)
CO2: 23 mmol/L (ref 22–32)
Calcium: 8.7 mg/dL — ABNORMAL LOW (ref 8.9–10.3)
Creatinine, Ser: 0.85 mg/dL (ref 0.44–1.00)
GFR calc Af Amer: >60 mL/min (ref >60)
GFR calc non Af Amer: >60 mL/min (ref >60)
Glucose, Bld: 170 mg/dL — ABNORMAL HIGH (ref 70–99)
Potassium: 3.8 mmol/L (ref 3.5–5.1)
Sodium: 137 mmol/L (ref 135–145)

## 2019-01-14 LAB — CBC
HCT: 42.7 % (ref 36.0–46.0)
Hemoglobin: 14 g/dL (ref 12.0–15.0)
MCH: 29.9 pg (ref 26.0–34.0)
MCHC: 32.8 g/dL (ref 30.0–36.0)
MCV: 91.2 fL (ref 80.0–100.0)
Platelets: 316 10*3/uL (ref 150–400)
RBC: 4.68 MIL/uL (ref 3.87–5.11)
RDW: 12.6 % (ref 11.5–15.5)
WBC: 11.6 10*3/uL — AB (ref 4.0–10.5)
nRBC: 0 % (ref 0.0–0.2)

## 2019-01-14 LAB — TROPONIN I: Troponin I: <0.03 ng/mL (ref <0.03)

## 2019-01-14 LAB — INFLUENZA PANEL BY PCR (TYPE A & B)
Influenza A By PCR: NEGATIVE
Influenza B By PCR: POSITIVE — AB

## 2019-01-14 MED ORDER — IPRATROPIUM-ALBUTEROL 0.5-2.5 (3) MG/3ML IN SOLN
3.0000 mL | Freq: Once | RESPIRATORY_TRACT | Status: AC
  Administered 2019-01-14: 3 mL via RESPIRATORY_TRACT
  Filled 2019-01-14: qty 3

## 2019-01-14 MED ORDER — OSELTAMIVIR PHOSPHATE 75 MG PO CAPS: 75.0000 mg | ORAL_CAPSULE | Freq: Two times a day (BID) | ORAL | 0 refills | Status: AC

## 2019-01-14 MED ORDER — ACETAMINOPHEN 500 MG PO TABS
1000.0000 mg | ORAL_TABLET | Freq: Once | ORAL | Status: AC
  Administered 2019-01-14: 1000 mg via ORAL
  Filled 2019-01-14: qty 2

## 2019-01-14 MED ORDER — LIDOCAINE HCL (PF) 4 % IJ SOLN
4.0000 mL | Freq: Once | INTRAMUSCULAR | Status: DC
  Filled 2019-01-14: qty 5

## 2019-01-14 MED ORDER — ALBUTEROL SULFATE (2.5 MG/3ML) 0.083% IN NEBU
5.0000 mg | INHALATION_SOLUTION | Freq: Once | RESPIRATORY_TRACT | Status: AC
  Administered 2019-01-14: 5 mg via RESPIRATORY_TRACT
  Filled 2019-01-14: qty 6

## 2019-01-14 MED ORDER — LIDOCAINE HCL (PF) 1 % IJ SOLN
INTRAMUSCULAR | Status: AC
  Administered 2019-01-14: 5 mL
  Filled 2019-01-14: qty 5

## 2019-01-14 MED ORDER — PREDNISONE 20 MG PO TABS
60.0000 mg | ORAL_TABLET | Freq: Once | ORAL | Status: AC
  Administered 2019-01-14: 60 mg via ORAL
  Filled 2019-01-14: qty 3

## 2019-01-14 MED ORDER — ALBUTEROL SULFATE HFA 108 (90 BASE) MCG/ACT IN AERS: 2.0000 | INHALATION_SPRAY | Freq: Four times a day (QID) | RESPIRATORY_TRACT | 2 refills | Status: DC | PRN

## 2019-01-14 MED ORDER — PREDNISONE 20 MG PO TABS: 40.0000 mg | ORAL_TABLET | Freq: Every day | ORAL | 0 refills | Status: AC

## 2019-01-14 NOTE — ED Provider Notes (Signed)
[facility] Emergency Department Provider Note    First MD Initiated Contact with Patient 01/14/19 1131     (approximate)  I have reviewed the triage vital signs and the nursing notes.   HISTORY  Chief Complaint Chest Pain    HPI [name] is a 33 y.o. female the below listed past medical history presents the ER with 5 days of chest tightness cough and shortness of breath.  States that she has been treated previously for asthma.  Her daughter was recently sick with flulike illness.  She is not been on any antibiotics.  States that she was having trouble catching her breath and feels like her voice is weak.  Denies any pain at this time.      Past Medical History:  Diagnosis Date  . Mouth ulcers    No family history on file. Past Surgical History:  Procedure Laterality Date  . CESAREAN SECTION     Patient Active Problem List   Diagnosis Date Noted  . Back pain affecting pregnancy 03/28/2018  . Irregular contractions 11/10/2015      Prior to Admission medications   Medication Sig Start Date End Date Taking? Authorizing Provider  acetaminophen-codeine (TYLENOL #3) 300-30 MG tablet Take 1 tablet by mouth every 8 (eight) hours as needed for moderate pain. Patient not taking: Reported on 03/28/2018 12/15/16   Menshew, Dannielle Karvonen, PA-C  albuterol (PROVENTIL HFA;VENTOLIN HFA) 108 (90 Base) MCG/ACT inhaler Inhale 2 puffs into the lungs every 6 (six) hours as needed for wheezing or shortness of breath. 01/14/19   Merlyn Lot, MD  benzonatate (TESSALON PERLES) 100 MG capsule Take 1 capsule (100 mg total) by mouth 3 (three) times daily as needed for cough (Take 1-2 per dose). Patient not taking: Reported on 03/28/2018 12/15/16   Menshew, Dannielle Karvonen, PA-C  Doxylamine-Pyridoxine 10-10 MG TBEC Take 2 tablets by mouth at bedtime. Patient not taking: Reported on 05/12/2015 05/03/15   Sherrie George B, FNP  fluticasone (FLONASE) 50 MCG/ACT nasal spray  Place 2 sprays into both nostrils daily. Patient not taking: Reported on 03/28/2018 12/15/16   Menshew, Dannielle Karvonen, PA-C  lidocaine (XYLOCAINE) 2 % solution Use as directed 20 mLs in the mouth or throat as needed for mouth pain. Patient not taking: Reported on 11/10/2015 05/10/15   Harvest Dark, MD  metoCLOPramide (REGLAN) 5 MG tablet Take 1 tablet (5 mg total) by mouth every 8 (eight) hours as needed for nausea or vomiting. Patient not taking: Reported on 03/28/2018 09/21/17   Menshew, Dannielle Karvonen, PA-C  nitrofurantoin, macrocrystal-monohydrate, (MACROBID) 100 MG capsule Take 1 capsule (100 mg total) by mouth 2 (two) times daily. Patient not taking: Reported on 11/10/2015 06/04/15   Paulette Blanch, MD  nystatin (MYCOSTATIN) 100000 UNIT/ML suspension Take 5 mLs (500,000 Units total) by mouth 4 (four) times daily. Patient not taking: Reported on 11/10/2015 05/10/15   Harvest Dark, MD  ondansetron [facility]) 4 MG tablet Take 1-2 tabs by mouth every 8 hours as needed for nausea/vomiting Patient not taking: Reported on 11/10/2015 07/31/15   Hinda Kehr, MD  oseltamivir (TAMIFLU) 75 MG capsule Take 1 capsule (75 mg total) by mouth 2 (two) times daily for 5 days. 01/14/19 01/19/19  Merlyn Lot, MD  oxyCODONE (ROXICODONE) 5 MG/5ML solution Take 5 mLs (5 mg total) by mouth every 4 (four) hours as needed for severe pain. Patient not taking: Reported on 05/12/2015 05/10/15   Harvest Dark, MD  predniSONE (DELTASONE)  20 MG tablet Take 2 tablets (40 mg total) by mouth daily for 5 days. 01/14/19 01/19/19  Merlyn Lot, MD  Prenatal Vit-Fe Fumarate-FA (PRENATAL MULTIVITAMIN) TABS tablet Take 1 tablet by mouth daily at 12 noon.    [provider]    Allergies Tomato; Ibuprofen; and Penicillins    Social History Social History   Tobacco Use  . Smoking status: Never Smoker  . Smokeless tobacco: Never Used  Substance Use Topics  . Alcohol use: No  . Drug use: No    Review of  Systems Patient denies headaches, rhinorrhea, blurry vision, numbness, shortness of breath, chest pain, edema, cough, abdominal pain, nausea, vomiting, diarrhea, dysuria, fevers, rashes or hallucinations unless otherwise stated above in HPI. ____________________________________________   PHYSICAL EXAM:  VITAL SIGNS: Vitals:   01/14/19 1109 01/14/19 1547  BP: 117/62   Pulse: 97 (!) 120  Resp: 16   Temp: 98.8 F (37.1 C)   SpO2: 97% 99%    Constitutional: Alert and oriented.  Eyes: Conjunctivae are normal.  Head: Atraumatic. Nose: No congestion/rhinnorhea. Mouth/Throat: Mucous membranes are moist.   Neck: No stridor. Painless ROM.  Cardiovascular: Normal rate, regular rhythm. Grossly normal heart sounds.  Good peripheral circulation. Respiratory: Normal respiratory effort.  No retractions.lungs with coarse wheeze throughout Gastrointestinal: Soft and nontender. No distention. No abdominal bruits. No CVA tenderness. Genitourinary:  Musculoskeletal: No lower extremity tenderness nor edema.  No joint effusions. Neurologic:  Normal speech and language. No gross focal neurologic deficits are appreciated. No facial droop Skin:  Skin is warm, dry and intact. No rash noted. Psychiatric: Mood and affect are normal. Speech and behavior are normal.  ____________________________________________   LABS (all labs ordered are listed, but only abnormal results are displayed)  Results for orders placed or performed during the hospital encounter of 01/14/19 (from the past 24 hour(s))  Basic metabolic panel     Status: Abnormal   Collection Time: 01/14/19 11:20 AM  Result Value Ref Range   Sodium 137 135 - 145 mmol/L   Potassium 3.8 3.5 - 5.1 mmol/L   Chloride 108 98 - 111 mmol/L   CO2 23 22 - 32 mmol/L   Glucose, Bld 170 (H) 70 - 99 mg/dL   BUN 13 6 - 20 mg/dL   Creatinine, Ser 0.85 0.44 - 1.00 mg/dL   Calcium 8.7 (L) 8.9 - 10.3 mg/dL   GFR calc non Af Amer >60 >60 mL/min   GFR calc Af  Amer >60 >60 mL/min   Anion gap 6 5 - 15  CBC     Status: Abnormal   Collection Time: 01/14/19 11:20 AM  Result Value Ref Range   WBC 11.6 (H) 4.0 - 10.5 K/uL   RBC 4.68 3.87 - 5.11 MIL/uL   Hemoglobin 14.0 12.0 - 15.0 g/dL   HCT 42.7 36.0 - 46.0 %   MCV 91.2 80.0 - 100.0 fL   MCH 29.9 26.0 - 34.0 pg   MCHC 32.8 30.0 - 36.0 g/dL   RDW 12.6 11.5 - 15.5 %   Platelets 316 150 - 400 K/uL   nRBC 0.0 0.0 - 0.2 %  Troponin I - ONCE - STAT     Status: None   Collection Time: 01/14/19 11:20 AM  Result Value Ref Range   Troponin I <0.03 <0.03 ng/mL  Influenza panel by PCR (type A & B)     Status: Abnormal   Collection Time: 01/14/19 12:30 PM  Result Value Ref Range   Influenza A By  PCR NEGATIVE NEGATIVE   Influenza B By PCR POSITIVE (A) NEGATIVE   ____________________________________________  EKG My review and personal interpretation at Time: 11:16   Indication: sob  Rate: 95  Rhythm: sinus Axis: normal Other: normal intervals, no stemi, t wave inversions in inferior leads ____________________________________________  RADIOLOGY  I personally reviewed all radiographic images ordered to evaluate for the above acute complaints and reviewed radiology reports and findings.  These findings were personally discussed with the patient.  Please see medical record for radiology report.  ____________________________________________   PROCEDURES  Procedure(s) performed:  Procedures    Critical Care performed: no ____________________________________________   INITIAL IMPRESSION / ASSESSMENT AND PLAN / ED COURSE  Pertinent labs & imaging results that were available during my care of the patient were reviewed by me and considered in my medical decision making (see chart for details).   DDX: Asthma, copd, CHF, pna, ptx, malignancy, Pe, anemia   Derinda C Arp is a 33 y.o. who presents to the ED with symptoms as described above.  Patient afebrile and no acute distress.  Does have very  bronchitic sounding cough on exam.  Starting to cough more after receiving nebulizer treatment.  Does have some nonspecific EKG changes but this does not seem to be primarily cardiac illness.  Initial troponin is negative.  Rightward axis might be secondary to underlying pulmonary disease.  Will check flu as well as chest x-ray evaluate for pneumonia.  If these are both normal will further evaluate for PE given her symptoms however clinically she is well-appearing and is seems much more likely that this would be related to an infectious or bronchitic process.  Clinical Course as of Jan 14 1549  Tue Jan 14, 2019  1436 Patient reassessed.  Still persistent cough.  She is flu be positive.  This not consistent with PE or ACS.  Will give additional nebulizer treatment.  Will give Tamiflu.  If patient able to ambulate without any hypoxia plan discharge home.   [PR]  1548 Patient was able to ambulate without any hypoxia.  At this point I do believe she stable and appropriate for outpatient follow-up.  Discussed signs and symptoms for which she should return to the ER.   [PR]    Clinical Course User Index [PR] Merlyn Lot, MD     As part of my medical decision making, I reviewed the following data within the [place] notes reviewed and incorporated, Labs reviewed, notes from prior ED visits and [place] Controlled Substance Database   ____________________________________________   FINAL CLINICAL IMPRESSION(S) / ED DIAGNOSES  Final diagnoses:  Influenza B  Bronchitis      NEW MEDICATIONS STARTED DURING THIS VISIT:  New Prescriptions   ALBUTEROL (PROVENTIL HFA;VENTOLIN HFA) 108 (90 BASE) MCG/ACT INHALER    Inhale 2 puffs into the lungs every 6 (six) hours as needed for wheezing or shortness of breath.   OSELTAMIVIR (TAMIFLU) 75 MG CAPSULE    Take 1 capsule (75 mg total) by mouth 2 (two) times daily for 5 days.   PREDNISONE (DELTASONE) 20 MG TABLET    Take 2 tablets  (40 mg total) by mouth daily for 5 days.     Note:  This document was prepared using Dragon voice recognition software and may include unintentional dictation errors.    Merlyn Lot, MD 01/14/19 1550

## 2019-01-14 NOTE — ED Triage Notes (Addendum)
Pt arrives from work with complaints of chest tightness that started last week but has progressively increased in intensity. Pt also reports on her way to the ED she noticed blood after she coughed.

## 2019-01-14 NOTE — ED Notes (Signed)
AAOx3.  Skin warm and dry.  NAD 

## 2019-07-14 ENCOUNTER — Other Ambulatory Visit: Payer: Self-pay

## 2019-08-25 ENCOUNTER — Other Ambulatory Visit: Payer: Self-pay

## 2019-08-25 ENCOUNTER — Emergency Department
Admission: EM | Admit: 2019-08-25 | Discharge: 2019-08-25 | Disposition: A | Discharge status: Home / Self Care | Payer: Medicaid Other | Attending: Emergency Medicine | Admitting: Emergency Medicine

## 2019-08-25 ENCOUNTER — Encounter: Payer: Self-pay | Admitting: Emergency Medicine

## 2019-08-25 DIAGNOSIS — Z79899 Other long term (current) drug therapy: Secondary | ICD-10-CM | POA: Insufficient documentation

## 2019-08-25 DIAGNOSIS — R51 Headache: Secondary | ICD-10-CM | POA: Diagnosis present

## 2019-08-25 DIAGNOSIS — J45909 Unspecified asthma, uncomplicated: Secondary | ICD-10-CM | POA: Diagnosis not present

## 2019-08-25 DIAGNOSIS — G43909 Migraine, unspecified, not intractable, without status migrainosus: Secondary | ICD-10-CM | POA: Insufficient documentation

## 2019-08-25 MED ORDER — BUTALBITAL-APAP-CAFFEINE 50-325-40 MG PO TABS: 1.0000 | ORAL_TABLET | Freq: Four times a day (QID) | ORAL | 0 refills | Status: AC | PRN

## 2019-08-25 MED ORDER — METOCLOPRAMIDE HCL 5 MG/ML IJ SOLN
20.0000 mg | Freq: Once | INTRAVENOUS | Status: AC
  Administered 2019-08-25: 16:00:00 20 mg via INTRAVENOUS
  Filled 2019-08-25: qty 4

## 2019-08-25 MED ORDER — DIPHENHYDRAMINE HCL 50 MG/ML IJ SOLN
25.0000 mg | Freq: Once | INTRAMUSCULAR | Status: AC
  Administered 2019-08-25: 25 mg via INTRAVENOUS
  Filled 2019-08-25: qty 1

## 2019-08-25 NOTE — ED Notes (Signed)
Pt discharged at 1734 in NAD at this time. Delay in discharging off medical chart.

## 2019-08-25 NOTE — ED Provider Notes (Signed)
[facility] Emergency Department Provider Note   ____________________________________________    I have reviewed the triage vital signs and the nursing notes.   HISTORY  Chief Complaint Migraine     HPI [name] is a 33 y.o. female with a history of migraines who presents with complaints of migraine headache that has been ongoing for several days now.  She reports this is typical of her migraines however this 1 is lasting longer than usual.  She denies nausea or vomiting.  No neuro deficits.  No head injuries.  Has taken Imitrex without relief.  No fevers or chills or neck pain.  Past Medical History:  Diagnosis Date  . Asthma 2012  . Migraine 2008  . Mouth ulcers   . Pituitary adenoma ([place]) 2010    There are no active problems to display for this patient.   Past Surgical History:  Procedure Laterality Date  . CESAREAN SECTION      Prior to Admission medications   Medication Sig Start Date End Date Taking? Authorizing Provider  acetaminophen-codeine (TYLENOL #3) 300-30 MG tablet Take 1 tablet by mouth every 8 (eight) hours as needed for moderate pain. Patient not taking: Reported on 03/28/2018 12/15/16   Menshew, Dannielle Karvonen, PA-C  albuterol (PROVENTIL HFA;VENTOLIN HFA) 108 (90 Base) MCG/ACT inhaler Inhale 2 puffs into the lungs every 6 (six) hours as needed for wheezing or shortness of breath. 01/14/19   Merlyn Lot, MD  benzonatate (TESSALON PERLES) 100 MG capsule Take 1 capsule (100 mg total) by mouth 3 (three) times daily as needed for cough (Take 1-2 per dose). Patient not taking: Reported on 03/28/2018 12/15/16   Menshew, Dannielle Karvonen, PA-C  butalbital-acetaminophen-caffeine (FIORICET) [phone] MG tablet Take 1-2 tablets by mouth every 6 (six) hours as needed for headache. 08/25/19 08/24/20  Lavonia Drafts, MD  Doxylamine-Pyridoxine 10-10 MG TBEC Take 2 tablets by mouth at bedtime. Patient not taking: Reported on 05/12/2015  05/03/15   Sherrie George B, FNP  fluticasone (FLONASE) 50 MCG/ACT nasal spray Place 2 sprays into both nostrils daily. Patient not taking: Reported on 03/28/2018 12/15/16   Menshew, Dannielle Karvonen, PA-C  lidocaine (XYLOCAINE) 2 % solution Use as directed 20 mLs in the mouth or throat as needed for mouth pain. Patient not taking: Reported on 11/10/2015 05/10/15   Harvest Dark, MD  metoCLOPramide (REGLAN) 5 MG tablet Take 1 tablet (5 mg total) by mouth every 8 (eight) hours as needed for nausea or vomiting. Patient not taking: Reported on 03/28/2018 09/21/17   Menshew, Dannielle Karvonen, PA-C  nitrofurantoin, macrocrystal-monohydrate, (MACROBID) 100 MG capsule Take 1 capsule (100 mg total) by mouth 2 (two) times daily. Patient not taking: Reported on 11/10/2015 06/04/15   Paulette Blanch, MD  nystatin (MYCOSTATIN) 100000 UNIT/ML suspension Take 5 mLs (500,000 Units total) by mouth 4 (four) times daily. Patient not taking: Reported on 11/10/2015 05/10/15   Harvest Dark, MD  ondansetron [facility]) 4 MG tablet Take 1-2 tabs by mouth every 8 hours as needed for nausea/vomiting Patient not taking: Reported on 11/10/2015 07/31/15   Hinda Kehr, MD  oxyCODONE (ROXICODONE) 5 MG/5ML solution Take 5 mLs (5 mg total) by mouth every 4 (four) hours as needed for severe pain. Patient not taking: Reported on 05/12/2015 05/10/15   Harvest Dark, MD  Prenatal Vit-Fe Fumarate-FA (PRENATAL MULTIVITAMIN) TABS tablet Take 1 tablet by mouth daily at 12 noon.    [provider]     Allergies Tomato,  Ibuprofen, and Penicillins  Family History  Problem Relation Age of Onset  . Hypertension Maternal Grandmother   . Lung cancer Maternal Grandmother     Social History Social History   Tobacco Use  . Smoking status: Never Smoker  . Smokeless tobacco: Never Used  Substance Use Topics  . Alcohol use: No  . Drug use: No    Review of Systems  Constitutional: No fever/chills Eyes: No visual changes.   ENT: No sore throat. Cardiovascular: Denies chest pain. Respiratory: Denies shortness of breath. Gastrointestinal: No abdominal pain.  No nausea, no vomiting.   Genitourinary: Negative for dysuria. Musculoskeletal: Negative for back pain. Skin: Negative for rash. Neurological: As above   ____________________________________________   PHYSICAL EXAM:  VITAL SIGNS: ED Triage Vitals  Enc Vitals Group     BP 08/25/19 1439 121/88     Pulse Rate 08/25/19 1438 89     Resp 08/25/19 1438 16     Temp 08/25/19 1438 98.6 F (37 C)     Temp Source 08/25/19 1438 Oral     SpO2 08/25/19 1438 96 %     Weight 08/25/19 1438 108.9 kg (240 lb)     Height 08/25/19 1438 1.626 m (5\' 4" )     Head Circumference --      Peak Flow --      Pain Score 08/25/19 1438 7     Pain Loc --      Pain Edu? --      Excl. in [place]? --     Constitutional: Alert and oriented. Eyes: Conjunctivae are normal.  PERRLA, EOMI Head: Atraumatic. Nose: No congestion/rhinnorhea.  No sinus tenderness Mouth/Throat: Mucous membranes are moist.   Neck:  Painless ROM Cardiovascular: Normal rate, regular rhythm.  Good peripheral circulation. Respiratory: Normal respiratory effort.  No retractions.    Musculoskeletal: Normal strength in all extremities.  Warm and well perfused Neurologic:  Normal speech and language. No gross focal neurologic deficits are appreciated.  Cranial nerves II through XII are normal Skin:  Skin is warm, dry and intact. No rash noted. Psychiatric: Mood and affect are normal. Speech and behavior are normal.  ____________________________________________   LABS (all labs ordered are listed, but only abnormal results are displayed)  Labs Reviewed - No data to display ____________________________________________  EKG   ____________________________________________  RADIOLOGY   ____________________________________________   PROCEDURES  Procedure(s) performed: No  Procedures   Critical  Care performed: No ____________________________________________   INITIAL IMPRESSION / ASSESSMENT AND PLAN / ED COURSE  Pertinent labs & imaging results that were available during my care of the patient were reviewed by me and considered in my medical decision making (see chart for details).  Patient describes her symptoms is consistent with her usual migraine headache.  Not breaking with Imitrex, will give Benadryl and Reglan and reevaluate.  Quite reassuring exam  After Benadryl and Reglan, patient reports complete resolution of headache, appropriate for discharge at this time, return precautions discussed, outpatient follow-up    ____________________________________________   FINAL CLINICAL IMPRESSION(S) / ED DIAGNOSES  Final diagnoses:  Migraine without status migrainosus, not intractable, unspecified migraine type        Note:  This document was prepared using Dragon voice recognition software and may include unintentional dictation errors.   Lavonia Drafts, MD 08/25/19 [phone]

## 2019-08-25 NOTE — ED Triage Notes (Signed)
Pt c/o migraine since last week.  Has taken her imitrex but not helping. Took a vicodin she had at home as well without relief.  Pt has mild nausea but no vomiting. Mild photophobia. Feels like normal migraines but pain worse and mild blurry vision bilateral.  Ambulatory with steady gait. NAD. VSS

## 2019-09-18 ENCOUNTER — Other Ambulatory Visit: Payer: Self-pay | Admitting: Acute Care

## 2019-09-18 DIAGNOSIS — R519 Headache, unspecified: Secondary | ICD-10-CM

## 2019-10-01 ENCOUNTER — Ambulatory Visit
Admission: RE | Admit: 2019-10-01 | Discharge: 2019-10-01 | Disposition: A | Discharge status: Home / Self Care | Payer: Medicaid Other | Source: Ambulatory Visit | Attending: Acute Care | Admitting: Acute Care

## 2019-10-01 ENCOUNTER — Other Ambulatory Visit: Payer: Self-pay

## 2019-10-01 DIAGNOSIS — R519 Headache, unspecified: Secondary | ICD-10-CM | POA: Insufficient documentation

## 2019-10-01 MED ORDER — GADOBUTROL 1 MMOL/ML IV SOLN
10.0000 mL | Freq: Once | INTRAVENOUS | Status: AC | PRN
  Administered 2019-10-01: 10 mL via INTRAVENOUS

## 2020-07-13 ENCOUNTER — Ambulatory Visit (LOCAL_COMMUNITY_HEALTH_CENTER): Payer: Self-pay

## 2020-07-13 ENCOUNTER — Other Ambulatory Visit: Payer: Self-pay

## 2020-07-13 DIAGNOSIS — Z111 Encounter for screening for respiratory tuberculosis: Secondary | ICD-10-CM

## 2020-07-16 ENCOUNTER — Ambulatory Visit (LOCAL_COMMUNITY_HEALTH_CENTER): Payer: Medicaid Other

## 2020-07-16 ENCOUNTER — Other Ambulatory Visit: Payer: Self-pay

## 2020-07-16 DIAGNOSIS — Z111 Encounter for screening for respiratory tuberculosis: Secondary | ICD-10-CM

## 2020-07-16 LAB — TB SKIN TEST
Induration: 0 mm
TB Skin Test: NEGATIVE

## 2020-12-09 ENCOUNTER — Other Ambulatory Visit: Payer: Self-pay

## 2020-12-09 ENCOUNTER — Emergency Department
Admission: EM | Admit: 2020-12-09 | Discharge: 2020-12-09 | Disposition: A | Discharge status: Home / Self Care | Payer: Medicaid Other | Attending: Emergency Medicine | Admitting: Emergency Medicine

## 2020-12-09 DIAGNOSIS — J45909 Unspecified asthma, uncomplicated: Secondary | ICD-10-CM | POA: Insufficient documentation

## 2020-12-09 DIAGNOSIS — R0981 Nasal congestion: Secondary | ICD-10-CM | POA: Insufficient documentation

## 2020-12-09 DIAGNOSIS — M791 Myalgia, unspecified site: Secondary | ICD-10-CM | POA: Diagnosis not present

## 2020-12-09 DIAGNOSIS — R059 Cough, unspecified: Secondary | ICD-10-CM | POA: Insufficient documentation

## 2020-12-09 DIAGNOSIS — Z20822 Contact with and (suspected) exposure to covid-19: Secondary | ICD-10-CM | POA: Insufficient documentation

## 2020-12-09 LAB — SARS CORONAVIRUS 2 (TAT 6-24 HRS): SARS Coronavirus 2: NEGATIVE

## 2020-12-09 NOTE — ED Triage Notes (Signed)
Pt comes into the ED with c/o cough congestion body aches for the past 2-3 days, pt is vaccinated about 1 month prior. States she was exposed to family that tested positive.

## 2020-12-09 NOTE — Discharge Instructions (Addendum)
You may follow-up with results your Covid 19 test in the MyChart app.  Instruction for download app is in your discharge care instructions.

## 2020-12-09 NOTE — ED Provider Notes (Signed)
[facility] Emergency Department Provider Note   ____________________________________________   Event Date/Time   First MD Initiated Contact with Patient 12/09/20 0818     (approximate)  I have reviewed the triage vital signs and the nursing notes.   HISTORY  Chief Complaint URI    HPI DANAKA LLERA is a 34 y.o. female patient presents with cough, chest congestion, and body aches for the past 3 days.  Patient that she was vaccinated 1 month ago for COVID-19.  Patient is she exposed to family member did test positive for days ago.         Past Medical History:  Diagnosis Date  . Asthma 2012  . Migraine 2008  . Mouth ulcers   . Pituitary adenoma (HCC) 2010    There are no problems to display for this patient.   Past Surgical History:  Procedure Laterality Date  . CESAREAN SECTION      Prior to Admission medications   Medication Sig Start Date End Date Taking? Authorizing Provider  acetaminophen-codeine (TYLENOL #3) 300-30 MG tablet Take 1 tablet by mouth every 8 (eight) hours as needed for moderate pain. Patient not taking: Reported on 03/28/2018 12/15/16   Menshew, Charlesetta Ivory, PA-C  albuterol (PROVENTIL HFA;VENTOLIN HFA) 108 (90 Base) MCG/ACT inhaler Inhale 2 puffs into the lungs every 6 (six) hours as needed for wheezing or shortness of breath. 01/14/19   Willy Eddy, MD  benzonatate (TESSALON PERLES) 100 MG capsule Take 1 capsule (100 mg total) by mouth 3 (three) times daily as needed for cough (Take 1-2 per dose). Patient not taking: Reported on 03/28/2018 12/15/16   Menshew, Charlesetta Ivory, PA-C  Doxylamine-Pyridoxine 10-10 MG TBEC Take 2 tablets by mouth at bedtime. Patient not taking: Reported on 05/12/2015 05/03/15   Kem Boroughs B, FNP  fluticasone (FLONASE) 50 MCG/ACT nasal spray Place 2 sprays into both nostrils daily. Patient not taking: Reported on 03/28/2018 12/15/16   Menshew, Charlesetta Ivory, PA-C  lidocaine (XYLOCAINE) 2 %  solution Use as directed 20 mLs in the mouth or throat as needed for mouth pain. Patient not taking: Reported on 11/10/2015 05/10/15   Minna Antis, MD  metoCLOPramide (REGLAN) 5 MG tablet Take 1 tablet (5 mg total) by mouth every 8 (eight) hours as needed for nausea or vomiting. Patient not taking: Reported on 03/28/2018 09/21/17   Menshew, Charlesetta Ivory, PA-C  nitrofurantoin, macrocrystal-monohydrate, (MACROBID) 100 MG capsule Take 1 capsule (100 mg total) by mouth 2 (two) times daily. Patient not taking: Reported on 11/10/2015 06/04/15   Irean Hong, MD  nystatin (MYCOSTATIN) 100000 UNIT/ML suspension Take 5 mLs (500,000 Units total) by mouth 4 (four) times daily. Patient not taking: Reported on 11/10/2015 05/10/15   Minna Antis, MD  ondansetron [facility]) 4 MG tablet Take 1-2 tabs by mouth every 8 hours as needed for nausea/vomiting Patient not taking: Reported on 11/10/2015 07/31/15   Loleta Rose, MD  oxyCODONE (ROXICODONE) 5 MG/5ML solution Take 5 mLs (5 mg total) by mouth every 4 (four) hours as needed for severe pain. Patient not taking: Reported on 05/12/2015 05/10/15   Minna Antis, MD  Prenatal Vit-Fe Fumarate-FA (PRENATAL MULTIVITAMIN) TABS tablet Take 1 tablet by mouth daily at 12 noon.    [provider]    Allergies Tomato, Ibuprofen, and Penicillins  Family History  Problem Relation Age of Onset  . Hypertension Maternal Grandmother   . Lung cancer Maternal Grandmother     Social History Social  History   Tobacco Use  . Smoking status: Never Smoker  . Smokeless tobacco: Never Used  Substance Use Topics  . Alcohol use: No  . Drug use: No    Review of Systems  Constitutional: No fever/chills.  Body aches. Eyes: No visual changes. ENT: No sore throat. Cardiovascular: Denies chest pain. Respiratory: Denies shortness of breath.  Nonproductive cough. Gastrointestinal: No abdominal pain.  No nausea, no vomiting.  No diarrhea.  No  constipation. Genitourinary: Negative for dysuria. Musculoskeletal: Negative for back pain. Skin: Negative for rash. Neurological: Negative for headaches, focal weakness or numbness. Hematological/Lymphatic:  Pituitary adenoma Allergic/Immunilogical: Ibuprofen, penicillin, and tomatoes. ____________________________________________   PHYSICAL EXAM:  VITAL SIGNS: ED Triage Vitals  Enc Vitals Group     BP      Pulse      Resp      Temp      Temp src      SpO2      Weight      Height      Head Circumference      Peak Flow      Pain Score      Pain Loc      Pain Edu?      Excl. in [place]?     Constitutional: Alert and oriented. Well appearing and in no acute distress. Eyes: Conjunctivae are normal. PERRL. EOMI. Head: Atraumatic. Nose: Bilateral maxillary guarding with edematous nasal turbinates. Mouth/Throat: Mucous membranes are moist.  Oropharynx erythematous.  Postnasal drainage. Neck: No stridor.  Hematological/Lymphatic/Immunilogical: No cervical lymphadenopathy. Cardiovascular: Normal rate, regular rhythm. Grossly normal heart sounds.  Good peripheral circulation. Respiratory: Normal respiratory effort.  No retractions. Lungs CTAB. Gastrointestinal: Soft and nontender. No distention. No abdominal bruits. No CVA tenderness. Genitourinary: Deferred Musculoskeletal: No lower extremity tenderness nor edema.  No joint effusions. Neurologic:  Normal speech and language. No gross focal neurologic deficits are appreciated. No gait instability. Skin:  Skin is warm, dry and intact. No rash noted. Psychiatric: Mood and affect are normal. Speech and behavior are normal.  ____________________________________________   LABS (all labs ordered are listed, but only abnormal results are displayed)  Labs Reviewed  SARS CORONAVIRUS 2 (TAT 6-24 HRS)  POC SARS CORONAVIRUS 2 AG -  ED    ____________________________________________  EKG   ____________________________________________  RADIOLOGY Cecilio Asper, personally viewed and evaluated these images (plain radiographs) as part of my medical decision making, as well as reviewing the written report by the radiologist.  ED MD interpretation:    Official radiology report(s): No results found.  ____________________________________________   PROCEDURES  Procedure(s) performed (including Critical Care):  Procedures   ____________________________________________   INITIAL IMPRESSION / ASSESSMENT AND PLAN / ED COURSE  As part of my medical decision making, I reviewed the following data within the [place]         Patient presents with cough congestion and body aches.  Patient states she was exposed to family member tested positive for COVID-19.  Patient has completed both COVID-19 vaccine earlier this fall.  Patient given discharge care instructions and advised to follow-up for the results in the MyChart app.  Take medication as directed.      ____________________________________________   FINAL CLINICAL IMPRESSION(S) / ED DIAGNOSES  Final diagnoses:  Exposure to confirmed case of COVID-19     ED Discharge Orders    None      *Please note:  KENNIE KARAPETIAN was evaluated in Emergency Department on 12/09/2020 for  the symptoms described in the history of present illness. She was evaluated in the context of the global COVID-19 pandemic, which necessitated consideration that the patient might be at risk for infection with the SARS-CoV-2 virus that causes COVID-19. Institutional protocols and algorithms that pertain to the evaluation of patients at risk for COVID-19 are in a state of rapid change based on information released by regulatory bodies including the CDC and federal and state organizations. These policies and algorithms were followed during the patient's care in the ED.   Some ED evaluations and interventions may be delayed as a result of limited staffing during and the pandemic.*   Note:  This document was prepared using Dragon voice recognition software and may include unintentional dictation errors.    Joni Reining, PA-C 12/09/20 1050    Dionne Bucy, MD 12/09/20 1122

## 2020-12-17 ENCOUNTER — Encounter: Payer: Self-pay | Admitting: Emergency Medicine

## 2020-12-17 ENCOUNTER — Emergency Department
Admission: EM | Admit: 2020-12-17 | Discharge: 2020-12-17 | Disposition: A | Discharge status: Home / Self Care | Payer: Medicaid Other | Attending: Emergency Medicine | Admitting: Emergency Medicine

## 2020-12-17 ENCOUNTER — Other Ambulatory Visit: Payer: Self-pay

## 2020-12-17 DIAGNOSIS — R070 Pain in throat: Secondary | ICD-10-CM | POA: Diagnosis present

## 2020-12-17 DIAGNOSIS — Z20822 Contact with and (suspected) exposure to covid-19: Secondary | ICD-10-CM | POA: Diagnosis not present

## 2020-12-17 DIAGNOSIS — J45909 Unspecified asthma, uncomplicated: Secondary | ICD-10-CM | POA: Diagnosis not present

## 2020-12-17 DIAGNOSIS — R059 Cough, unspecified: Secondary | ICD-10-CM | POA: Insufficient documentation

## 2020-12-17 DIAGNOSIS — R5383 Other fatigue: Secondary | ICD-10-CM | POA: Diagnosis not present

## 2020-12-17 LAB — SARS CORONAVIRUS 2 (TAT 6-24 HRS): SARS Coronavirus 2: NEGATIVE

## 2020-12-17 NOTE — ED Triage Notes (Signed)
C/O sore throat and cough x 1 day.  AAOx3.  Skin warm and dry. NAD

## 2020-12-17 NOTE — ED Provider Notes (Signed)
[facility] Emergency Department Provider Note   ____________________________________________    I have reviewed the triage vital signs and the nursing notes.   HISTORY  Chief Complaint Sore Throat     HPI [name] is a 35 y.o. female who presents with complaints of sore throat, mild cough mild fatigue for 1 day, is concerned of possible COVID-19. No shortness of breath. Afebrile here.  Past Medical History:  Diagnosis Date  . Asthma 2012  . Migraine 2008  . Mouth ulcers   . Pituitary adenoma ([place]) 2010    There are no problems to display for this patient.   Past Surgical History:  Procedure Laterality Date  . CESAREAN SECTION      Prior to Admission medications   Medication Sig Start Date End Date Taking? Authorizing Provider  acetaminophen-codeine (TYLENOL #3) 300-30 MG tablet Take 1 tablet by mouth every 8 (eight) hours as needed for moderate pain. Patient not taking: Reported on 03/28/2018 12/15/16   Menshew, Dannielle Karvonen, PA-C  albuterol (PROVENTIL HFA;VENTOLIN HFA) 108 (90 Base) MCG/ACT inhaler Inhale 2 puffs into the lungs every 6 (six) hours as needed for wheezing or shortness of breath. 01/14/19   Merlyn Lot, MD  benzonatate (TESSALON PERLES) 100 MG capsule Take 1 capsule (100 mg total) by mouth 3 (three) times daily as needed for cough (Take 1-2 per dose). Patient not taking: Reported on 03/28/2018 12/15/16   Menshew, Dannielle Karvonen, PA-C  Doxylamine-Pyridoxine 10-10 MG TBEC Take 2 tablets by mouth at bedtime. Patient not taking: Reported on 05/12/2015 05/03/15   Sherrie George B, FNP  fluticasone (FLONASE) 50 MCG/ACT nasal spray Place 2 sprays into both nostrils daily. Patient not taking: Reported on 03/28/2018 12/15/16   Menshew, Dannielle Karvonen, PA-C  lidocaine (XYLOCAINE) 2 % solution Use as directed 20 mLs in the mouth or throat as needed for mouth pain. Patient not taking: Reported on 11/10/2015 05/10/15   Harvest Dark, MD  metoCLOPramide (REGLAN) 5 MG tablet Take 1 tablet (5 mg total) by mouth every 8 (eight) hours as needed for nausea or vomiting. Patient not taking: Reported on 03/28/2018 09/21/17   Menshew, Dannielle Karvonen, PA-C  nitrofurantoin, macrocrystal-monohydrate, (MACROBID) 100 MG capsule Take 1 capsule (100 mg total) by mouth 2 (two) times daily. Patient not taking: Reported on 11/10/2015 06/04/15   Paulette Blanch, MD  nystatin (MYCOSTATIN) 100000 UNIT/ML suspension Take 5 mLs (500,000 Units total) by mouth 4 (four) times daily. Patient not taking: Reported on 11/10/2015 05/10/15   Harvest Dark, MD  ondansetron [facility]) 4 MG tablet Take 1-2 tabs by mouth every 8 hours as needed for nausea/vomiting Patient not taking: Reported on 11/10/2015 07/31/15   Hinda Kehr, MD  oxyCODONE (ROXICODONE) 5 MG/5ML solution Take 5 mLs (5 mg total) by mouth every 4 (four) hours as needed for severe pain. Patient not taking: Reported on 05/12/2015 05/10/15   Harvest Dark, MD  Prenatal Vit-Fe Fumarate-FA (PRENATAL MULTIVITAMIN) TABS tablet Take 1 tablet by mouth daily at 12 noon.    [provider]     Allergies Tomato, Ibuprofen, and Penicillins  Family History  Problem Relation Age of Onset  . Hypertension Maternal Grandmother   . Lung cancer Maternal Grandmother     Social History Social History   Tobacco Use  . Smoking status: Never Smoker  . Smokeless tobacco: Never Used  Substance Use Topics  . Alcohol use: No  . Drug use: No  Review of Systems  Constitutional: No fever/chills  ENT: As above   Gastrointestinal: No abdominal pain.     Genitourinary: Negative for dysuria. Musculoskeletal: Positive myalgias. Skin: Negative for rash. Neurological: Negative for headaches     ____________________________________________   PHYSICAL EXAM:  VITAL SIGNS: ED Triage Vitals  Enc Vitals Group     BP 12/17/20 1006 120/74     Pulse Rate 12/17/20 1006 99     Resp  12/17/20 1006 16     Temp 12/17/20 1006 99.4 F (37.4 C)     Temp Source 12/17/20 1006 Oral     SpO2 12/17/20 1006 99 %     Weight 12/17/20 1007 99.8 kg (220 lb)     Height 12/17/20 1007 1.626 m (5\' 4" )     Head Circumference --      Peak Flow --      Pain Score 12/17/20 1007 6     Pain Loc --      Pain Edu? --      Excl. in [place]? --     Constitutional: Alert and oriented. No acute distress. Pleasant and interactive Eyes: Conjunctivae are normal.  Head: Atraumatic. Nose: No congestion/rhinnorhea. Mouth/Throat: Mucous membranes are moist. Pharynx normal Cardiovascular: Normal rate, regular rhythm.  Respiratory: Normal respiratory effort.    Musculoskeletal: No lower extremity tenderness nor edema.   Neurologic:  Normal speech and language. No gross focal neurologic deficits are appreciated.   Skin:  Skin is warm, dry and intact. No rash noted.   ____________________________________________   LABS (all labs ordered are listed, but only abnormal results are displayed)  Labs Reviewed  SARS CORONAVIRUS 2 (TAT 6-24 HRS)   ____________________________________________  EKG   ____________________________________________  RADIOLOGY   ____________________________________________   PROCEDURES  Procedure(s) performed: No  Procedures   Critical Care performed: No ____________________________________________   INITIAL IMPRESSION / ASSESSMENT AND PLAN / ED COURSE  Pertinent labs & imaging results that were available during my care of the patient were reviewed by me and considered in my medical decision making (see chart for details).  Patient well-appearing and in no acute distress, vital signs reassuring, symptoms suspicious for COVID-19 versus viral upper respiratory infection, Covid swab sent   ____________________________________________   FINAL CLINICAL IMPRESSION(S) / ED DIAGNOSES  Final diagnoses:  Suspected COVID-19 virus infection      NEW  MEDICATIONS STARTED DURING THIS VISIT:  Discharge Medication List as of 12/17/2020 10:18 AM       Note:  This document was prepared using Dragon voice recognition software and may include unintentional dictation errors.   Lavonia Drafts, MD 12/17/20 [phone]

## 2020-12-17 NOTE — ED Notes (Signed)
AAOx3.  Skin warm and dry.  NAD 

## 2021-01-24 ENCOUNTER — Emergency Department
Admission: EM | Admit: 2021-01-24 | Discharge: 2021-01-24 | Disposition: A | Discharge status: Home / Self Care | Payer: Medicaid Other | Attending: Emergency Medicine | Admitting: Emergency Medicine

## 2021-01-24 ENCOUNTER — Other Ambulatory Visit: Payer: Self-pay

## 2021-01-24 ENCOUNTER — Encounter: Payer: Self-pay | Admitting: Emergency Medicine

## 2021-01-24 ENCOUNTER — Emergency Department: Payer: Medicaid Other

## 2021-01-24 DIAGNOSIS — R509 Fever, unspecified: Secondary | ICD-10-CM | POA: Insufficient documentation

## 2021-01-24 DIAGNOSIS — R079 Chest pain, unspecified: Secondary | ICD-10-CM | POA: Diagnosis present

## 2021-01-24 DIAGNOSIS — R059 Cough, unspecified: Secondary | ICD-10-CM | POA: Insufficient documentation

## 2021-01-24 DIAGNOSIS — R0602 Shortness of breath: Secondary | ICD-10-CM | POA: Diagnosis not present

## 2021-01-24 DIAGNOSIS — J45909 Unspecified asthma, uncomplicated: Secondary | ICD-10-CM | POA: Diagnosis not present

## 2021-01-24 DIAGNOSIS — Z20822 Contact with and (suspected) exposure to covid-19: Secondary | ICD-10-CM | POA: Insufficient documentation

## 2021-01-24 DIAGNOSIS — R0989 Other specified symptoms and signs involving the circulatory and respiratory systems: Secondary | ICD-10-CM | POA: Diagnosis not present

## 2021-01-24 DIAGNOSIS — R0789 Other chest pain: Secondary | ICD-10-CM | POA: Diagnosis not present

## 2021-01-24 MED ORDER — AZITHROMYCIN 250 MG PO TABS: ORAL_TABLET | ORAL | 0 refills | Status: DC

## 2021-01-24 MED ORDER — BENZONATATE 100 MG PO CAPS: 100.0000 mg | ORAL_CAPSULE | Freq: Three times a day (TID) | ORAL | 0 refills | Status: AC | PRN

## 2021-01-24 MED ORDER — METHYLPREDNISOLONE 4 MG PO TBPK: ORAL_TABLET | ORAL | 0 refills | Status: DC

## 2021-01-24 NOTE — Discharge Instructions (Signed)
Follow-up with your regular doctor if not improved in 2 to 3 days.  Return emergency department worsening. If Covid test is positive you will need to finish quarantine which would be today.  However he should not return to work until he has not had a fever for 24 hours.  Take over-the-counter vitamin C, vitamin D, and zinc to help boost your immune system Take over-the-counter Mucinex for cough as needed Take over-the-counter Tylenol and ibuprofen for fever and body aches Follow-up with your regular doctor as needed Return emergency department worsening Quarantine for 5 days if you are vaccinated and 10 days if you are not.  Samule Dry is from onset of symptoms.  At that time you may return to school/work if your symptoms have improved and you have been fever free for 24 hours.

## 2021-01-24 NOTE — ED Triage Notes (Signed)
Says she was sick since Wednesday with chills, sweating and body aches.  Still with sore throat and cough.  Says the right side of her chest hurts to lay on it now.  No short ness of breath.

## 2021-01-24 NOTE — ED Provider Notes (Signed)
[facility] Emergency Department Provider Note  ____________________________________________   Event Date/Time   First MD Initiated Contact with Patient 01/24/21 1406     (approximate)  I have reviewed the triage vital signs and the nursing notes.   HISTORY  Chief Complaint Cough and Chest Pain    HPI [name] is a 35 y.o. female presents to the emergency department with URI symptoms for 5 days.   Is complaining of cough, congestion, fever, chills, chest pain, shortness of breath denies close contact with Covid19+ patient, patient is vaccinated.   Past Medical History:  Diagnosis Date  . Asthma 2012  . Migraine 2008  . Mouth ulcers   . Pituitary adenoma ([place]) 2010    There are no problems to display for this patient.   Past Surgical History:  Procedure Laterality Date  . CESAREAN SECTION      Prior to Admission medications   Medication Sig Start Date End Date Taking? Authorizing Provider  azithromycin (ZITHROMAX Z-PAK) 250 MG tablet 2 pills today then 1 pill a day for 4 days 01/24/21  Yes Atiba Kimberlin, Linden Dolin, PA-C  benzonatate (TESSALON PERLES) 100 MG capsule Take 1 capsule (100 mg total) by mouth 3 (three) times daily as needed for cough. 01/24/21 01/24/22 Yes Tyrel Lex, Linden Dolin, PA-C  methylPREDNISolone (MEDROL DOSEPAK) 4 MG TBPK tablet Take 6 pills on day one then decrease by 1 pill each day 01/24/21  Yes Chiyeko Ferre, Linden Dolin, PA-C  albuterol (PROVENTIL HFA;VENTOLIN HFA) 108 (90 Base) MCG/ACT inhaler Inhale 2 puffs into the lungs every 6 (six) hours as needed for wheezing or shortness of breath. 01/14/19   Merlyn Lot, MD  Prenatal Vit-Fe Fumarate-FA (PRENATAL MULTIVITAMIN) TABS tablet Take 1 tablet by mouth daily at 12 noon.    [provider]  fluticasone (FLONASE) 50 MCG/ACT nasal spray Place 2 sprays into both nostrils daily. Patient not taking: Reported on 03/28/2018 12/15/16 01/24/21  Menshew, Dannielle Karvonen, PA-C  metoCLOPramide  (REGLAN) 5 MG tablet Take 1 tablet (5 mg total) by mouth every 8 (eight) hours as needed for nausea or vomiting. Patient not taking: Reported on 03/28/2018 09/21/17 01/24/21  Menshew, Dannielle Karvonen, PA-C    Allergies Tomato, Ibuprofen, and Penicillins  Family History  Problem Relation Age of Onset  . Hypertension Maternal Grandmother   . Lung cancer Maternal Grandmother     Social History Social History   Tobacco Use  . Smoking status: Never Smoker  . Smokeless tobacco: Never Used  Substance Use Topics  . Alcohol use: No  . Drug use: No    Review of Systems  Constitutional: Positive fever/chills Eyes: No visual changes. ENT: Positive sore throat. Respiratory: Positive cough Cardiovascular: Positive chest pain Gastrointestinal: Denies abdominal pain Genitourinary: Negative for dysuria. Musculoskeletal: Negative for back pain. Skin: Negative for rash. Neurological: Denies neurological changes    ____________________________________________   PHYSICAL EXAM:  VITAL SIGNS: ED Triage Vitals  Enc Vitals Group     BP 01/24/21 1308 127/74     Pulse Rate 01/24/21 1308 69     Resp 01/24/21 1308 14     Temp --      Temp src --      SpO2 01/24/21 1308 99 %     Weight 01/24/21 1309 220 lb (99.8 kg)     Height --      Head Circumference --      Peak Flow --      Pain Score 01/24/21 1308 8  Pain Loc --      Pain Edu? --      Excl. in [place]? --     Constitutional: Alert and oriented. Well appearing and in no acute distress. Eyes: Conjunctivae are normal.  Head: Atraumatic. Nose: No congestion/rhinnorhea. Mouth/Throat: Mucous membranes are moist.  Throat appears normal Neck:  supple no lymphadenopathy noted Cardiovascular: Normal rate, regular rhythm. Heart sounds are normal Respiratory: Normal respiratory effort.  No retractions, lungs CTA GU: deferred Musculoskeletal: FROM all extremities, warm and well perfused Neurologic:  Normal speech and language.  Skin:   Skin is warm, dry and intact. No rash noted. Psychiatric: Mood and affect are normal. Speech and behavior are normal.  ____________________________________________   LABS (all labs ordered are listed, but only abnormal results are displayed)  Labs Reviewed  SARS CORONAVIRUS 2 (TAT 6-24 HRS)   ____________________________________________   ____________________________________________  RADIOLOGY  Chest x-ray  ____________________________________________   PROCEDURES  Procedure(s) performed: No  Procedures    ____________________________________________   INITIAL IMPRESSION / ASSESSMENT AND PLAN / ED COURSE  Pertinent labs & imaging results that were available during my care of the patient were reviewed by me and considered in my medical decision making (see chart for details).   Patient is a 35 year old female who complains of URI symptoms.  Exam is consistent with covid.   pending test for covid  Chest x-ray ordered  Chest x-ray is normal.  Pending Covid test. Explained findings to the patient.  She is given prescription for Z-Pak, Advil Dosepak, and Tessalon Perles.  She is to return emergency department worsening.  The patient was instructed to quarantine themselves at home.  Follow-up with your regular doctor if any concerns.  Return emergency department for worsening. OTC measures discussed     [name] was evaluated in Emergency Department on 01/24/2021 for the symptoms described in the history of present illness. She was evaluated in the context of the global COVID-19 pandemic, which necessitated consideration that the patient might be at risk for infection with the SARS-CoV-2 virus that causes COVID-19. Institutional protocols and algorithms that pertain to the evaluation of patients at risk for COVID-19 are in a state of rapid change based on information released by regulatory bodies including the CDC and federal and state organizations. These policies  and algorithms were followed during the patient's care in the ED.   As part of my medical decision making, I reviewed the following data within the [place] notes reviewed and incorporated, Old chart reviewed, Radiograph reviewed , Notes from prior ED visits and [place] Controlled Substance Database  ____________________________________________   FINAL CLINICAL IMPRESSION(S) / ED DIAGNOSES  Final diagnoses:  Acute chest wall pain      NEW MEDICATIONS STARTED DURING THIS VISIT:  New Prescriptions   AZITHROMYCIN (ZITHROMAX Z-PAK) 250 MG TABLET    2 pills today then 1 pill a day for 4 days   BENZONATATE (TESSALON PERLES) 100 MG CAPSULE    Take 1 capsule (100 mg total) by mouth 3 (three) times daily as needed for cough.   METHYLPREDNISOLONE (MEDROL DOSEPAK) 4 MG TBPK TABLET    Take 6 pills on day one then decrease by 1 pill each day     Note:  This document was prepared using Dragon voice recognition software and may include unintentional dictation errors.    Versie Starks, PA-C 01/24/21 1457    Vladimir Crofts, MD 01/24/21 1550

## 2021-01-25 LAB — SARS CORONAVIRUS 2 (TAT 6-24 HRS): SARS Coronavirus 2: NEGATIVE

## 2022-08-28 ENCOUNTER — Encounter
Admission: RE | Admit: 2022-08-28 | Discharge: 2022-08-28 | Disposition: A | Discharge status: Home / Self Care | Payer: Medicaid Other | Source: Ambulatory Visit | Attending: Orthopedic Surgery | Admitting: Orthopedic Surgery

## 2022-08-28 ENCOUNTER — Other Ambulatory Visit: Payer: Self-pay | Admitting: Orthopedic Surgery

## 2022-08-28 VITALS — Ht 64.0 in | Wt 242.0 lb

## 2022-08-28 DIAGNOSIS — Z01812 Encounter for preprocedural laboratory examination: Secondary | ICD-10-CM

## 2022-08-28 HISTORY — DX: Erythema multiforme, unspecified: L51.9

## 2022-08-28 NOTE — Patient Instructions (Signed)
Your procedure is scheduled on: Tuesday, September 26 Report to the Registration Desk on the 1st floor of the [facility]. To find out your arrival time, please call [phone] between 1PM - 3PM on: Monday, September 25 If your arrival time is 6:00 am, do not arrive prior to that time as the [facility] entrance doors do not open until 6:00 am.  REMEMBER: Instructions that are not followed completely may result in serious medical risk, up to and including death; or upon the discretion of your surgeon and anesthesiologist your surgery may need to be rescheduled.  Do not eat or drink after midnight the night before surgery.  No gum chewing, lozengers or hard candies.  DO NOT TAKE ANY MEDICATIONS THE MORNING OF SURGERY   One week prior to surgery: starting September 19 Stop meloxicam and Anti-inflammatories (NSAIDS) such as Advil, Aleve, Ibuprofen, Motrin, Naproxen, Naprosyn and Aspirin based products such as Excedrin, Goodys Powder, BC Powder. Stop ANY OVER THE COUNTER supplements until after surgery. You may however, continue to take Tylenol if needed for pain up until the day of surgery.  No Alcohol for 24 hours before or after surgery.  No Smoking including e-cigarettes for 24 hours prior to surgery.  No chewable tobacco products for at least 6 hours prior to surgery.  No nicotine patches on the day of surgery.  Do not use any "recreational" drugs for at least a week prior to your surgery.  Please be advised that the combination of cocaine and anesthesia may have negative outcomes, up to and including death. If you test positive for cocaine, your surgery will be cancelled.  On the morning of surgery brush your teeth with toothpaste and water, you may rinse your mouth with mouthwash if you wish. Do not swallow any toothpaste or mouthwash.  Use CHG Soap as directed on instruction sheet.  Do not wear jewelry, make-up, hairpins, clips or nail polish.  Do not wear lotions,  powders, or perfumes.   Do not shave body from the neck down 48 hours prior to surgery just in case you cut yourself which could leave a site for infection.  Also, freshly shaved skin may become irritated if using the CHG soap.  Contact lenses, hearing aids and dentures may not be worn into surgery.  Do not bring valuables to the hospital. [facility] is not responsible for any missing/lost belongings or valuables.   Notify your doctor if there is any change in your medical condition (cold, fever, infection).  Wear comfortable clothing (specific to your surgery type) to the hospital.  After surgery, you can help prevent lung complications by doing breathing exercises.  Take deep breaths and cough every 1-2 hours. Your doctor may order a device called an Incentive Spirometer to help you take deep breaths.  If you are being discharged the day of surgery, you will not be allowed to drive home. You will need a responsible adult (18 years or older) to drive you home and stay with you that night.   If you are taking public transportation, you will need to have a responsible adult (18 years or older) with you. Please confirm with your physician that it is acceptable to use public transportation.   Please call the [facility] at [phone] if you have any questions about these instructions.  Surgery Visitation Policy:  Patients undergoing a surgery or procedure may have two family members or support persons with them as long as the person is not COVID-19  positive or experiencing its symptoms.      Preparing for Surgery with [place] (CHG) Soap  Before surgery, you can play an important role by reducing the number of germs on your skin.  CHG (Chlorhexidine gluconate) soap is an antiseptic cleanser which kills germs and bonds with the skin to continue killing germs even after washing.  Please do not use if you have an allergy to CHG or antibacterial  soaps. If your skin becomes reddened/irritated stop using the CHG.  1. Shower the NIGHT BEFORE SURGERY and the MORNING OF SURGERY with CHG soap.  2. If you choose to wash your hair, wash your hair first as usual with your normal shampoo.  3. After shampooing, rinse your hair and body thoroughly to remove the shampoo.  4. Use CHG as you would any other liquid soap. You can apply CHG directly to the skin and wash gently with a scrungie or a clean washcloth.  5. Apply the CHG soap to your body only from the neck down. Do not use on open wounds or open sores. Avoid contact with your eyes, ears, mouth, and genitals (private parts). Wash face and genitals (private parts) with your normal soap.  6. Wash thoroughly, paying special attention to the area where your surgery will be performed.  7. Thoroughly rinse your body with warm water.  8. Do not shower/wash with your normal soap after using and rinsing off the CHG soap.  9. Pat yourself dry with a clean towel.  10. Wear clean pajamas to bed the night before surgery.  12. Place clean sheets on your bed the night of your first shower and do not sleep with pets.  13. Shower again with the CHG soap on the day of surgery prior to arriving at the hospital.  14. Do not apply any deodorants/lotions/powders.  15. Please wear clean clothes to the hospital.

## 2022-09-01 ENCOUNTER — Other Ambulatory Visit: Payer: Self-pay | Admitting: Orthopedic Surgery

## 2022-09-04 MED ORDER — CHLORHEXIDINE GLUCONATE 0.12 % MT SOLN: 15.0000 mL | Freq: Once | OROMUCOSAL | Status: AC

## 2022-09-04 MED ORDER — LACTATED RINGERS IV SOLN: INTRAVENOUS | Status: DC

## 2022-09-04 MED ORDER — ORAL CARE MOUTH RINSE: 15.0000 mL | Freq: Once | OROMUCOSAL | Status: AC

## 2022-09-04 MED ORDER — ACETAMINOPHEN 500 MG PO TABS: 1000.0000 mg | ORAL_TABLET | ORAL | Status: AC

## 2022-09-04 MED ORDER — VANCOMYCIN HCL IN DEXTROSE 1-5 GM/200ML-% IV SOLN: 1000.0000 mg | INTRAVENOUS | Status: DC

## 2022-09-04 MED ORDER — FAMOTIDINE 20 MG PO TABS: 20.0000 mg | ORAL_TABLET | Freq: Once | ORAL | Status: AC

## 2022-09-04 MED ORDER — CHLORHEXIDINE GLUCONATE CLOTH 2 % EX PADS: 6.0000 | MEDICATED_PAD | Freq: Once | CUTANEOUS | Status: DC

## 2022-09-05 ENCOUNTER — Other Ambulatory Visit: Payer: Self-pay

## 2022-09-05 ENCOUNTER — Ambulatory Visit
Admission: RE | Admit: 2022-09-05 | Discharge: 2022-09-05 | Disposition: A | Discharge status: Home / Self Care | Payer: Medicaid Other | Attending: Orthopedic Surgery | Admitting: Orthopedic Surgery

## 2022-09-05 ENCOUNTER — Ambulatory Visit: Payer: Medicaid Other | Admitting: Anesthesiology

## 2022-09-05 ENCOUNTER — Encounter
Admission: RE | Disposition: A | Discharge status: Home / Self Care | Payer: Self-pay | Source: Home / Self Care | Attending: Orthopedic Surgery

## 2022-09-05 ENCOUNTER — Encounter: Payer: Self-pay | Admitting: Orthopedic Surgery

## 2022-09-05 ENCOUNTER — Ambulatory Visit: Payer: Medicaid Other | Admitting: Urgent Care

## 2022-09-05 DIAGNOSIS — S83231A Complex tear of medial meniscus, current injury, right knee, initial encounter: Secondary | ICD-10-CM | POA: Diagnosis not present

## 2022-09-05 DIAGNOSIS — X58XXXA Exposure to other specified factors, initial encounter: Secondary | ICD-10-CM | POA: Insufficient documentation

## 2022-09-05 DIAGNOSIS — Z6841 Body Mass Index (BMI) 40.0 and over, adult: Secondary | ICD-10-CM | POA: Insufficient documentation

## 2022-09-05 DIAGNOSIS — S83241A Other tear of medial meniscus, current injury, right knee, initial encounter: Secondary | ICD-10-CM | POA: Diagnosis present

## 2022-09-05 DIAGNOSIS — Z01812 Encounter for preprocedural laboratory examination: Secondary | ICD-10-CM

## 2022-09-05 HISTORY — PX: KNEE ARTHROSCOPY WITH MEDIAL MENISECTOMY: SHX5651

## 2022-09-05 LAB — POCT PREGNANCY, URINE: Preg Test, Ur: NEGATIVE

## 2022-09-05 SURGERY — ARTHROSCOPY, KNEE, WITH MEDIAL MENISCECTOMY
Anesthesia: General | Site: Knee | Laterality: Right

## 2022-09-05 MED ORDER — SUCCINYLCHOLINE CHLORIDE 200 MG/10ML IV SOSY
PREFILLED_SYRINGE | INTRAVENOUS | Status: DC | PRN
  Administered 2022-09-05: 140 mg via INTRAVENOUS

## 2022-09-05 MED ORDER — BUPIVACAINE-EPINEPHRINE (PF) 0.25% -1:200000 IJ SOLN
INTRAMUSCULAR | Status: AC
  Filled 2022-09-05: qty 30

## 2022-09-05 MED ORDER — ONDANSETRON HCL 4 MG PO TABS: 4.0000 mg | ORAL_TABLET | Freq: Three times a day (TID) | ORAL | 0 refills | Status: DC | PRN

## 2022-09-05 MED ORDER — LIDOCAINE HCL (PF) 1 % IJ SOLN
INTRAMUSCULAR | Status: AC
  Filled 2022-09-05: qty 30

## 2022-09-05 MED ORDER — CHLORHEXIDINE GLUCONATE 0.12 % MT SOLN
OROMUCOSAL | Status: AC
  Administered 2022-09-05: 15 mL via OROMUCOSAL
  Filled 2022-09-05: qty 15

## 2022-09-05 MED ORDER — CEFAZOLIN SODIUM-DEXTROSE 2-4 GM/100ML-% IV SOLN
INTRAVENOUS | Status: AC
  Filled 2022-09-05: qty 100

## 2022-09-05 MED ORDER — OXYCODONE HCL 5 MG PO TABS
ORAL_TABLET | ORAL | Status: AC
  Filled 2022-09-05: qty 1

## 2022-09-05 MED ORDER — RINGERS IRRIGATION IR SOLN
Status: DC | PRN
  Administered 2022-09-05: 6000 mL
  Administered 2022-09-05: 3000 mL

## 2022-09-05 MED ORDER — MIDAZOLAM HCL 2 MG/2ML IJ SOLN
INTRAMUSCULAR | Status: AC
  Filled 2022-09-05: qty 2

## 2022-09-05 MED ORDER — BUPIVACAINE-EPINEPHRINE (PF) 0.25% -1:200000 IJ SOLN
INTRAMUSCULAR | Status: DC | PRN
  Administered 2022-09-05: 30 mL

## 2022-09-05 MED ORDER — DEXMEDETOMIDINE HCL IN NACL 200 MCG/50ML IV SOLN
INTRAVENOUS | Status: DC | PRN
  Administered 2022-09-05: 8 ug via INTRAVENOUS

## 2022-09-05 MED ORDER — ONDANSETRON HCL 4 MG/2ML IJ SOLN
INTRAMUSCULAR | Status: AC
  Administered 2022-09-05: 4 mg via INTRAVENOUS
  Filled 2022-09-05: qty 2

## 2022-09-05 MED ORDER — ROCURONIUM BROMIDE 100 MG/10ML IV SOLN
INTRAVENOUS | Status: DC | PRN
  Administered 2022-09-05 (×2): 50 mg via INTRAVENOUS

## 2022-09-05 MED ORDER — HYDROCORTISONE SOD SUC (PF) 100 MG IJ SOLR
INTRAMUSCULAR | Status: DC | PRN
  Administered 2022-09-05: 100 mg via INTRAVENOUS

## 2022-09-05 MED ORDER — MIDAZOLAM HCL 2 MG/2ML IJ SOLN
INTRAMUSCULAR | Status: DC | PRN
  Administered 2022-09-05: 2 mg via INTRAVENOUS

## 2022-09-05 MED ORDER — FENTANYL CITRATE (PF) 100 MCG/2ML IJ SOLN
INTRAMUSCULAR | Status: AC
  Filled 2022-09-05: qty 2

## 2022-09-05 MED ORDER — PHENYLEPHRINE HCL (PRESSORS) 10 MG/ML IV SOLN
INTRAVENOUS | Status: AC
  Filled 2022-09-05: qty 1

## 2022-09-05 MED ORDER — PHENYLEPHRINE 80 MCG/ML (10ML) SYRINGE FOR IV PUSH (FOR BLOOD PRESSURE SUPPORT)
PREFILLED_SYRINGE | INTRAVENOUS | Status: DC | PRN
  Administered 2022-09-05: 80 ug via INTRAVENOUS

## 2022-09-05 MED ORDER — KETAMINE HCL 10 MG/ML IJ SOLN
INTRAMUSCULAR | Status: DC | PRN
  Administered 2022-09-05: 10 mg via INTRAVENOUS
  Administered 2022-09-05: 40 mg via INTRAVENOUS

## 2022-09-05 MED ORDER — PROPOFOL 10 MG/ML IV BOLUS
INTRAVENOUS | Status: AC
  Filled 2022-09-05: qty 20

## 2022-09-05 MED ORDER — FENTANYL CITRATE (PF) 100 MCG/2ML IJ SOLN
INTRAMUSCULAR | Status: AC
  Administered 2022-09-05: 25 ug via INTRAVENOUS
  Filled 2022-09-05: qty 2

## 2022-09-05 MED ORDER — FAMOTIDINE 20 MG PO TABS
ORAL_TABLET | ORAL | Status: AC
  Administered 2022-09-05: 20 mg via ORAL
  Filled 2022-09-05: qty 1

## 2022-09-05 MED ORDER — LIDOCAINE HCL (CARDIAC) PF 100 MG/5ML IV SOSY
PREFILLED_SYRINGE | INTRAVENOUS | Status: DC | PRN
  Administered 2022-09-05: 80 mg via INTRAVENOUS

## 2022-09-05 MED ORDER — HYDROCODONE-ACETAMINOPHEN 5-325 MG PO TABS: 1.0000 | ORAL_TABLET | ORAL | 0 refills | Status: DC | PRN

## 2022-09-05 MED ORDER — LIDOCAINE HCL (PF) 1 % IJ SOLN
INTRAMUSCULAR | Status: DC | PRN
  Administered 2022-09-05: 7 mL

## 2022-09-05 MED ORDER — OXYCODONE HCL 5 MG/5ML PO SOLN: 5.0000 mg | Freq: Once | ORAL | Status: AC | PRN

## 2022-09-05 MED ORDER — FENTANYL CITRATE (PF) 100 MCG/2ML IJ SOLN
25.0000 ug | INTRAMUSCULAR | Status: DC | PRN
  Administered 2022-09-05: 25 ug via INTRAVENOUS

## 2022-09-05 MED ORDER — OXYCODONE HCL 5 MG PO TABS
5.0000 mg | ORAL_TABLET | Freq: Once | ORAL | Status: AC | PRN
  Administered 2022-09-05: 5 mg via ORAL

## 2022-09-05 MED ORDER — FENTANYL CITRATE (PF) 100 MCG/2ML IJ SOLN
INTRAMUSCULAR | Status: DC | PRN
  Administered 2022-09-05 (×2): 25 ug via INTRAVENOUS
  Administered 2022-09-05 (×2): 50 ug via INTRAVENOUS

## 2022-09-05 MED ORDER — ONDANSETRON HCL 4 MG/2ML IJ SOLN
INTRAMUSCULAR | Status: DC | PRN
  Administered 2022-09-05: 4 mg via INTRAVENOUS

## 2022-09-05 MED ORDER — SEVOFLURANE IN SOLN
RESPIRATORY_TRACT | Status: AC
  Filled 2022-09-05: qty 250

## 2022-09-05 MED ORDER — SUGAMMADEX SODIUM 500 MG/5ML IV SOLN
INTRAVENOUS | Status: DC | PRN
  Administered 2022-09-05: 439.2 mg via INTRAVENOUS

## 2022-09-05 MED ORDER — ASPIRIN 325 MG PO TBEC: 325.0000 mg | DELAYED_RELEASE_TABLET | Freq: Every day | ORAL | 0 refills | Status: DC

## 2022-09-05 MED ORDER — PROPOFOL 10 MG/ML IV BOLUS
INTRAVENOUS | Status: DC | PRN
  Administered 2022-09-05: 200 mg via INTRAVENOUS

## 2022-09-05 MED ORDER — VANCOMYCIN HCL IN DEXTROSE 1-5 GM/200ML-% IV SOLN
INTRAVENOUS | Status: AC
  Filled 2022-09-05: qty 200

## 2022-09-05 MED ORDER — ACETAMINOPHEN 500 MG PO TABS
ORAL_TABLET | ORAL | Status: AC
  Administered 2022-09-05: 1000 mg via ORAL
  Filled 2022-09-05: qty 2

## 2022-09-05 MED ORDER — KETAMINE HCL 50 MG/5ML IJ SOSY
PREFILLED_SYRINGE | INTRAMUSCULAR | Status: AC
  Filled 2022-09-05: qty 5

## 2022-09-05 MED ORDER — ONDANSETRON HCL 4 MG/2ML IJ SOLN: 4.0000 mg | Freq: Once | INTRAMUSCULAR | Status: AC | PRN

## 2022-09-05 SURGICAL SUPPLY — 44 items
ADAPTER IRRIG TUBE 2 SPIKE SOL (ADAPTER) ×2 IMPLANT
BLADE FULL RADIUS 3.5 (BLADE) IMPLANT
BLADE SHAVER 4.5X7 STR FR (MISCELLANEOUS) IMPLANT
BUR BR 5.5 WIDE MOUTH (BURR) IMPLANT
CUFF TOURN SGL QUICK 24 (TOURNIQUET CUFF)
CUFF TOURN SGL QUICK 34 (TOURNIQUET CUFF)
CUFF TRNQT CYL 24X4X16.5-23 (TOURNIQUET CUFF) IMPLANT
CUFF TRNQT CYL 34X4.125X (TOURNIQUET CUFF) IMPLANT
DRAPE ARTHRO LIMB 89X125 STRL (DRAPES) ×1 IMPLANT
DRAPE IMP U-DRAPE 54X76 (DRAPES) ×1 IMPLANT
DURAPREP 26ML APPLICATOR (WOUND CARE) ×3 IMPLANT
GAUZE SPONGE 4X4 12PLY STRL (GAUZE/BANDAGES/DRESSINGS) ×1 IMPLANT
GAUZE XEROFORM 1X8 LF (GAUZE/BANDAGES/DRESSINGS) ×1 IMPLANT
GLOVE BIOGEL PI IND STRL 9 (GLOVE) ×1 IMPLANT
GLOVE BIOGEL PI ORTHO SZ9 (GLOVE) ×4 IMPLANT
GOWN STRL REUS W/ TWL LRG LVL3 (GOWN DISPOSABLE) ×1 IMPLANT
GOWN STRL REUS W/TWL 2XL LVL3 (GOWN DISPOSABLE) ×1 IMPLANT
GOWN STRL REUS W/TWL LRG LVL3 (GOWN DISPOSABLE) ×1
IV LACTATED RINGER IRRG 3000ML (IV SOLUTION) ×3
IV LR IRRIG 3000ML ARTHROMATIC (IV SOLUTION) ×6 IMPLANT
KIT TURNOVER KIT A (KITS) ×1 IMPLANT
MANIFOLD NEPTUNE II (INSTRUMENTS) ×2 IMPLANT
MAT ABSORB  FLUID 56X50 GRAY (MISCELLANEOUS) ×1
MAT ABSORB FLUID 56X50 GRAY (MISCELLANEOUS) ×1 IMPLANT
NEEDLE HYPO 22GX1.5 SAFETY (NEEDLE) ×1 IMPLANT
PACK ARTHROSCOPY KNEE (MISCELLANEOUS) ×1 IMPLANT
PAD ABD DERMACEA PRESS 5X9 (GAUZE/BANDAGES/DRESSINGS) ×2 IMPLANT
PAD WRAPON POLOR MULTI XL (MISCELLANEOUS) IMPLANT
SHAVER BLADE TAPERED BLUNT 4 (BLADE) IMPLANT
SLEEVE REMOTE CONTROL 5X12 (DRAPES) IMPLANT
SOL PREP PVP 2OZ (MISCELLANEOUS) ×1
SOLUTION PREP PVP 2OZ (MISCELLANEOUS) ×1 IMPLANT
SPONGE T-LAP 18X18 ~~LOC~~+RFID (SPONGE) ×1 IMPLANT
STRIP CLOSURE SKIN 1/2X4 (GAUZE/BANDAGES/DRESSINGS) ×1 IMPLANT
SUT ETHILON 4-0 (SUTURE) ×1
SUT ETHILON 4-0 FS2 18XMFL BLK (SUTURE) ×1
SUTURE ETHLN 4-0 FS2 18XMF BLK (SUTURE) ×1 IMPLANT
TRAP FLUID SMOKE EVACUATOR (MISCELLANEOUS) ×1 IMPLANT
TUBING INFLOW SET DBFLO PUMP (TUBING) ×1 IMPLANT
TUBING OUTFLOW SET DBLFO PUMP (TUBING) ×1 IMPLANT
WAND WEREWOLF FLOW 90D (MISCELLANEOUS) ×1 IMPLANT
WATER STERILE IRR 500ML POUR (IV SOLUTION) ×1 IMPLANT
WRAP-ON POLOR PAD MULTI XL (MISCELLANEOUS) ×1
WRAPON POLOR PAD MULTI XL (MISCELLANEOUS) ×1

## 2022-09-05 NOTE — Op Note (Signed)
  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 female with a diagnosis of TEAR OF MEDIAL MENISCUS who failed conservative management and elected for surgical management.    The risks benefits and alternatives were discussed with the patient preoperatively including the risks of infection, bleeding, nerve injury, knee stiffness, persistent pain, osteoarthritis and the need for further surgery. Medical  risks include DVT and pulmonary embolism, myocardial infarction, stroke, pneumonia, respiratory failure and death. The patient understood these risks and wished to proceed.  OPERATIVE FINDINGS: Extensive complex tear of the medial meniscus involving the body and posterior horn.  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 The operative extremity was signed with the word yes and my initials according the hospital's correct site of surgery protocol.  The patient was brought to the operating room where she was placed supine on the operative table. General anesthesia was administered. The patient was prepped and draped in a sterile fashion.  A timeout was performed to verify the patient's name, date of birth, medical record number, correct site of surgery correct procedure to be performed. It was also used to verify the patient received antibiotics that all appropriate instruments, and radiographic studies were available in the room. Once all in attendance were in agreement, the case began.  Proposed arthroscopy incisions were drawn out with a  surgical marker. These were pre-injected with 1% lidocaine plain. An 11 blade was used to establish an inferior lateral and inferomedial portals. The inferomedial portal was created using a 18-gauge spinal needle under direct visualization.  A full diagnostic examination of the knee was performed including the suprapatellar pouch, patellofemoral joint, medial lateral compartments as well as the medial lateral gutters, the intercondylar notch in the posterior knee.  The patient had the medial meniscal tear treated with a Dyonics tapered shaver blade and straight duckbill basket. The meniscus was debrided until a stable rim was achieved. A partial synovectomy was also performed using a Dyonics tapered shaver blade and 90 [employer] werewolf wand.  The knee was then copiously lavaged. All arthroscopic instruments were removed. The two arthroscopy portals were closed with 4-0 nylon.  0.25% Marcaine with epi was injected intra-articularly to assist with postop pain management.  Steri-Strips were applied along with a dry sterile and compressive dressing. The patient was brought to the PACU in stable condition. I was scrubbed and present for the entire case and all sharp and instrument counts were correct at the conclusion the case. I spoke with the patient's family postoperatively to let them know the case was performed without complication and the patient was stable in the recovery room.     Timoteo Gaul, MD

## 2022-09-05 NOTE — H&P (Signed)
PREOPERATIVE H&P  Chief Complaint: Right Knee Meniscal Tear  HPI: [name] is a 36 y.o. female who presents for preoperative history and physical with a diagnosis of Right Knee Meniscal Tear confirmed by MRI.  Medial sided knee pain and mechanical symptoms are significantly impairing activities of daily living.  She has failed nonoperative management wished to proceed with a right knee arthroscopic partial medial meniscectomy.  Past Medical History:  Diagnosis Date   Asthma 2012   Erythema multiforme    Migraine 2008   Mouth ulcers    Pituitary adenoma ([place]) 2010   Past Surgical History:  Procedure Laterality Date   CESAREAN SECTION  2019   CESAREAN SECTION  2017   CESAREAN SECTION  2002   Social History   Socioeconomic History   Marital status: Single    Spouse name: Not on file   Number of children: 4   Years of education: Not on file   Highest education level: Not on file  Occupational History   Not on file  Tobacco Use   Smoking status: Never   Smokeless tobacco: Never  Vaping Use   Vaping Use: Never used  Substance and Sexual Activity   Alcohol use: No   Drug use: No   Sexual activity: Yes    Birth control/protection: Injection  Other Topics Concern   Not on file  Social History Narrative   Lives with children   Social Determinants of Health   Financial Resource Strain: Not on file  Food Insecurity: Not on file  Transportation Needs: Not on file  Physical Activity: Not on file  Stress: Not on file  Social Connections: Not on file   Family History  Problem Relation Age of Onset   Hypertension Maternal Grandmother    Lung cancer Maternal Grandmother    Allergies  Allergen Reactions   Tomato Swelling   Ibuprofen Other (See Comments)    Reaction:  Sores in mouth   Penicillins Other (See Comments)    ERYTHEMA MULTIFORME REACTION Reaction:  Sores in mouth   Prior to Admission medications   Medication Sig Start Date End Date Taking?  Authorizing Provider  meloxicam (MOBIC) 7.5 MG tablet Take 7.5 mg by mouth daily as needed for pain.   Yes [provider]  valACYclovir (VALTREX) 500 MG tablet Take 500 mg by mouth daily.   Yes [provider]  predniSONE (STERAPRED UNI-PAK 21 TAB) 10 MG (21) TBPK tablet Take 10 mg by mouth daily. taper    [provider]  fluticasone (FLONASE) 50 MCG/ACT nasal spray Place 2 sprays into both nostrils daily. Patient not taking: Reported on 03/28/2018 12/15/16 01/24/21  Menshew, Dannielle Karvonen, PA-C  metoCLOPramide (REGLAN) 5 MG tablet Take 1 tablet (5 mg total) by mouth every 8 (eight) hours as needed for nausea or vomiting. Patient not taking: Reported on 03/28/2018 09/21/17 01/24/21  Menshew, Dannielle Karvonen, PA-C     Positive ROS: All other systems have been reviewed and were otherwise negative with the exception of those mentioned in the HPI and as above.  Physical Exam: General: Alert, no acute distress Cardiovascular: Regular rate and rhythm, no murmurs rubs or gallops.  No pedal edema Respiratory: Clear to auscultation bilaterally, no wheezes rales or rhonchi. No cyanosis, no use of accessory musculature GI: No organomegaly, abdomen is soft and non-tender nondistended with positive bowel sounds. Skin: Skin intact, no lesions within the operative field. Neurologic: Sensation intact distally Psychiatric: Patient is competent for consent with normal  mood and affect Lymphatic: No cervical lymphadenopathy  MUSCULOSKELETAL: Right knee: Patient's skin is intact. There is no erythema, ecchymosis, or large effusion. Her range of motion is from 0 to 90 degrees today. She had tenderness over the medial joint line and positive McMurray's test. Distally, she is neurovascularly intact.   Assessment: Right Knee Meniscal Tear  Plan: Plan for Procedure(s): RIGHT KNEE ARTHROSCOPIC PARTIAL MEDIAL MENISECTOMY  I reviewed the details of the operation as well as the postoperative  course with the patient and her mother who is with her in the preoperative area today.  A preop history and physical was performed at the bedside.  I marked the right knee according to the hospital's correct site of surgery protocol after verbally confirming with the patient that this was the correct site of surgery.  I discussed the risks and benefits of surgery. The risks include but are not limited to infection, bleeding, nerve or blood vessel injury, joint stiffness or loss of motion, persistent pain, weakness or instability, retear of the meniscus, osteoarthritis and the need for further surgery. Patient understood these risks and wished to proceed.     Thornton Park, MD   09/05/2022 7:59 AM

## 2022-09-05 NOTE — Anesthesia Procedure Notes (Signed)
Procedure Name: Intubation Date/Time: 09/05/2022 8:15 AM  Performed by: Nelda Marseille, CRNAPre-anesthesia Checklist: Patient identified, Patient being monitored, Timeout performed, Emergency Drugs available and Suction available Patient Re-evaluated:Patient Re-evaluated prior to induction Oxygen Delivery Method: Circle system utilized Preoxygenation: Pre-oxygenation with 100% oxygen Induction Type: IV induction Ventilation: Mask ventilation without difficulty Laryngoscope Size: Mac, 3 and McGraph Grade View: Grade III Tube type: Oral Tube size: 6.5 mm Number of attempts: 1 Airway Equipment and Method: Stylet Placement Confirmation: ETT inserted through vocal cords under direct vision, positive ETCO2 and breath sounds checked- equal and bilateral Secured at: 21 cm Tube secured with: Tape Dental Injury: Teeth and Oropharynx as per pre-operative assessment

## 2022-09-05 NOTE — Anesthesia Preprocedure Evaluation (Signed)
Anesthesia Evaluation  Patient identified by MRN, date of birth, ID band Patient awake    Reviewed: Allergy & Precautions, NPO status , Patient's Chart, lab work & pertinent test results  History of Anesthesia Complications Negative for: history of anesthetic complications  Airway Mallampati: III  TM Distance: >3 FB Neck ROM: Full    Dental no notable dental hx. (+) Teeth Intact   Pulmonary neg pulmonary ROS, neg sleep apnea, neg COPD, Patient abstained from smoking.Not current smoker,  Childhood asthma   Pulmonary exam normal breath sounds clear to auscultation       Cardiovascular Exercise Tolerance: Good METS(-) hypertension(-) CAD and (-) Past MI negative cardio ROS  (-) dysrhythmias  Rhythm:Regular Rate:Normal - Systolic murmurs    Neuro/Psych  Headaches, negative psych ROS   GI/Hepatic GERD  Poorly Controlled,(+)     (-) substance abuse  ,   Endo/Other  neg diabetesMorbid obesity  Renal/GU negative Renal ROS     Musculoskeletal Dermatology note: Erosion of oral mucosa, 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 Did not tolerate Cellcept so switched to Myfortic at discharge from most recent hospitalization 03/2022 - Patient self-discontinued nystatin swish and spit and magic mouthwash due to lack of benefit. - Discharged from ED on 03/12/2022- stopped calcium, vitamin D, and PPI after finishing 6-week course of prednisone taper    Abdominal (+) + obese,   Peds  Hematology   Anesthesia Other Findings Past Medical History: 2012: Asthma 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 2008: Migraine No date:  Mouth ulcers 2010: Pituitary adenoma ([place])  Reproductive/Obstetrics                             Anesthesia Physical Anesthesia Plan  ASA: 3  Anesthesia Plan: General   Post-op Pain Management: Tylenol PO (pre-op)*   Induction: Intravenous  PONV Risk Score and Plan: 4 or greater and Ondansetron, Dexamethasone and Midazolam  Airway Management Planned: Oral ETT  Additional Equipment: None  Intra-op Plan:   Post-operative Plan: Extubation in OR  Informed Consent: I have reviewed the patients History and Physical, chart, labs and discussed the procedure including the risks, benefits and alternatives for the proposed anesthesia with the patient or authorized representative who has indicated his/her understanding and acceptance.     Dental advisory given  Plan Discussed with: CRNA and Surgeon  Anesthesia Plan Comments: (Discussed risks of anesthesia with patient, including PONV, sore throat, lip/dental/eye damage. Rare risks discussed as well, such as cardiorespiratory and neurological sequelae, and allergic reactions. Discussed the role of CRNA in patient's perioperative care. Patient understands. Patient informed about increased incidence of above perioperative risk due to high BMI. Patient understands. Will adminster stress dose steroids due to chronic prednisone courses (last dose was '20mg'$  )   Patient has listed allergy to PCN - erythema multiforme Severe blistering skin reaction (SJS/TEN)? no Liver or kidney injury caused by PCN? no Hemolytic anemia from PCN? no Drug fever? no Painful swollen joints? no Severe reaction involving inside of mouth, eye, or genital ulcers? YES Based on current evidence Petra Kuba al, J Allergy Clin Immunol Pract, 2019), will proceed with cefazolin use: NO )  Anesthesia Quick Evaluation  

## 2022-09-05 NOTE — Discharge Instructions (Signed)
AMBULATORY SURGERY  DISCHARGE INSTRUCTIONS   The drugs that you were given will stay in your system until tomorrow so for the next 24 hours you should not:  Drive an automobile Make any legal decisions Drink any alcoholic beverage   You may resume regular meals tomorrow.  Today it is better to start with liquids and gradually work up to solid foods.  You may eat anything you prefer, but it is better to start with liquids, then soup and crackers, and gradually work up to solid foods.   Please notify your doctor immediately if you have any unusual bleeding, trouble breathing, redness and pain at the surgery site, drainage, fever, or pain not relieved by medication.    Additional Instructions:     Please contact your physician with any problems or Same Day Surgery at [phone], Monday through Friday 6 am to 4 pm, or Pump Back at [facility] number at [phone].   POLAR CARE INFORMATION  [email or web address]  How to use [place] Cold Therapy System?  YouTube   [email or web address]  OPERATING INSTRUCTIONS  Start the product With dry hands, connect the transformer to the electrical connection located on the top of the cooler. Next, plug the transformer into an appropriate electrical outlet. The unit will automatically start running at this point.  To stop the pump, disconnect electrical power.  Unplug to stop the product when not in use. Unplugging the Polar Care unit turns it off. Always unplug immediately after use. Never leave it plugged in while unattended. Remove pad.    FIRST ADD WATER TO FILL LINE, THEN ICE---Replace ice when existing ice is almost melted  1 Discuss Treatment with your [facility] Practitioner and Use Only as Prescribed 2 Apply Insulation Barrier & Cold Therapy Pad 3 Check for Moisture 4 Inspect Skin Regularly  Tips and Trouble Shooting Usage Tips 1. Use cubed or chunked ice for optimal  performance. 2. It is recommended to drain the Pad between uses. To drain the pad, hold the Pad upright with the hose pointed toward the ground. Depress the black plunger and allow water to drain out. 3. You may disconnect the Pad from the unit without removing the pad from the affected area by depressing the silver tabs on the hose coupling and gently pulling the hoses apart. The Pad and unit will seal itself and will not leak. Note: Some dripping during release is normal. 4. DO NOT RUN PUMP WITHOUT WATER! The pump in this unit is designed to run with water. Running the unit without water will cause permanent damage to the pump. 5. Unplug unit before removing lid.  TROUBLESHOOTING GUIDE Pump not running, Water not flowing to the pad, Pad is not getting cold 1. Make sure the transformer is plugged into the wall outlet. 2. Confirm that the ice and water are filled to the indicated levels. 3. Make sure there are no kinks in the pad. 4. Gently pull on the blue tube to make sure the tube/pad junction is straight. 5. Remove the pad from the treatment site and ll it while the pad is lying at; then reapply. 6. Confirm that the pad couplings are securely attached to the unit. Listen for the double clicks (Figure 1) to confirm the pad couplings are securely attached.  Leaks    Note: Some condensation on the lines, controller, and pads is unavoidable, especially in warmer climates. 1. If using a [place] Polar Care Cold Therapy unit with a detachable Cold Therapy Pad,  and a leak exists (other than condensation on the lines) disconnect the pad couplings. Make sure the silver tabs on the couplings are depressed before reconnecting the pad to the pump hose; then confirm both sides of the coupling are properly clicked in. 2. If the coupling continues to leak or a leak is detected in the pad itself, stop using it and call [facility] at [phone].  Cleaning After use, empty and dry the unit with a soft  cloth. Warm water and mild detergent may be used occasionally to clean the pump and tubes.  WARNING: The [place] can be cold enough to cause serious injury, including full skin necrosis. Follow these Operating Instructions, and carefully read the Product Insert (see pouch on side of unit) and the Cold Therapy Pad Fitting Instructions (provided with each Cold Therapy Pad) prior to use.

## 2022-09-05 NOTE — Anesthesia Postprocedure Evaluation (Signed)
Anesthesia Post Note  Patient: [name]  Procedure(s) Performed: KNEE ARTHROSCOPY WITH MEDIAL MENISECTOMY (Right: Knee)  Patient location during evaluation: PACU Anesthesia Type: General Level of consciousness: awake and alert Pain management: pain level controlled Vital Signs Assessment: post-procedure vital signs reviewed and stable Respiratory status: spontaneous breathing, nonlabored ventilation, respiratory function stable and patient connected to nasal cannula oxygen Cardiovascular status: blood pressure returned to baseline and stable Postop Assessment: no apparent nausea or vomiting Anesthetic complications: no   No notable events documented.   Last Vitals:  Vitals:   09/05/22 1015 09/05/22 1033  BP: 121/81 129/74  Pulse: 87 68  Resp: (!) 25 20  Temp:  (!) 36.1 C  SpO2: 100% 97%    Last Pain:  Vitals:   09/05/22 1033  TempSrc: Temporal  PainSc: 9                  Arita Miss

## 2022-09-05 NOTE — Transfer of Care (Signed)
Immediate Anesthesia Transfer of Care Note  Patient: [name]  Procedure(s) Performed: KNEE ARTHROSCOPY WITH MEDIAL MENISECTOMY (Right: Knee)  Patient Location: PACU  Anesthesia Type:General  Level of Consciousness: sedated  Airway & Oxygen Therapy: Patient Spontanous Breathing and Patient connected to face mask oxygen  Post-op Assessment: Report given to RN and Post -op Vital signs reviewed and stable  Post vital signs: Reviewed and stable  Last Vitals:  Vitals Value Taken Time  BP 114/72 09/05/22 0947  Temp 36.4 C 09/05/22 0947  Pulse 86 09/05/22 0952  Resp 23 09/05/22 0952  SpO2 99 % 09/05/22 0952  Vitals shown include unvalidated device data.  Last Pain:  Vitals:   09/05/22 8003  TempSrc: Oral  PainSc: 7          Complications: No notable events documented.

## 2022-09-06 ENCOUNTER — Encounter: Payer: Self-pay | Admitting: Orthopedic Surgery

## 2022-11-03 ENCOUNTER — Encounter: Payer: Self-pay | Admitting: Orthopedic Surgery

## 2022-11-03 NOTE — Addendum Note (Signed)
Addendum  created 11/03/22 1311 by Doreen Salvage, CRNA   Intraprocedure Event edited

## 2023-02-13 IMAGING — DX DG CHEST 1V PORT
1 series · 1 of 1 positions shown · non-contrast
Comparison: January 14, 2019

CLINICAL DATA: Cough and fever

EXAM:
PORTABLE CHEST 1 VIEW

[chest ap]
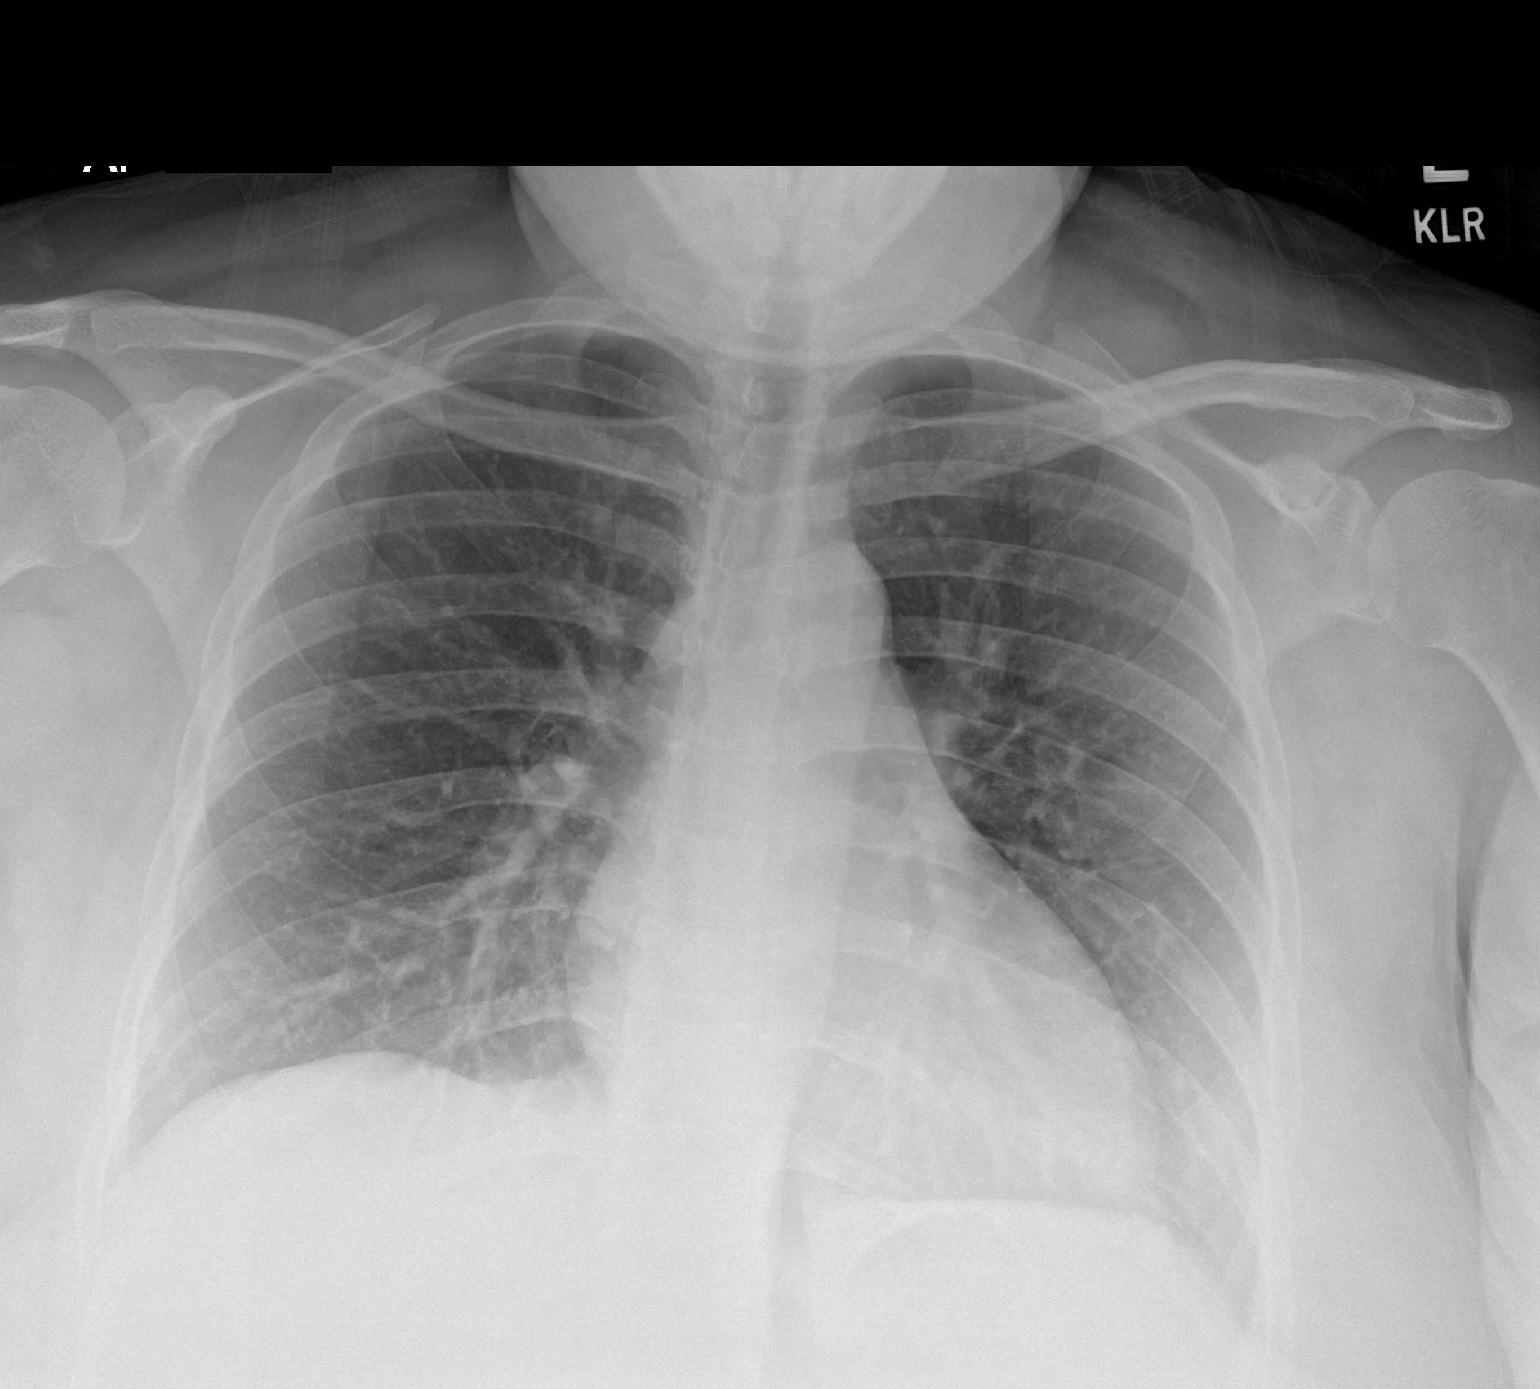

[1 of 1 positions shown; findings below may reference images not displayed]

FINDINGS: Lungs are clear. Heart size and pulmonary vascularity are within
normal limits. There is no evident adenopathy. No bone lesions.
IMPRESSION: Lungs clear.  Cardiac silhouette within normal limits.

## 2023-02-26 ENCOUNTER — Other Ambulatory Visit: Payer: Medicaid Other

## 2023-02-27 ENCOUNTER — Ambulatory Visit (LOCAL_COMMUNITY_HEALTH_CENTER): Payer: Self-pay

## 2023-02-27 DIAGNOSIS — Z111 Encounter for screening for respiratory tuberculosis: Secondary | ICD-10-CM

## 2023-03-02 ENCOUNTER — Ambulatory Visit (LOCAL_COMMUNITY_HEALTH_CENTER): Payer: Self-pay

## 2023-03-02 DIAGNOSIS — Z111 Encounter for screening for respiratory tuberculosis: Secondary | ICD-10-CM

## 2023-03-02 LAB — TB SKIN TEST
Induration: 0 mm
TB Skin Test: NEGATIVE

## 2023-04-23 ENCOUNTER — Encounter: Payer: Self-pay | Admitting: Ophthalmology

## 2023-04-23 NOTE — Anesthesia Preprocedure Evaluation (Signed)
Anesthesia Evaluation  Patient identified by MRN, date of birth, ID band Patient awake    Reviewed: Allergy & Precautions, H&P , NPO status , Patient's Chart, lab work & pertinent test results  Airway Mallampati: II  TM Distance: >3 FB Neck ROM: Full    Dental no notable dental hx.    Pulmonary asthma    Pulmonary exam normal breath sounds clear to auscultation       Cardiovascular negative cardio ROS Normal cardiovascular exam Rhythm:Regular Rate:Normal     Neuro/Psych  Headaches  negative psych ROS   GI/Hepatic negative GI ROS, Neg liver ROS,,,  Endo/Other  negative endocrine ROS    Renal/GU negative Renal ROS  negative genitourinary   Musculoskeletal negative musculoskeletal ROS (+)    Abdominal   Peds negative pediatric ROS (+)  Hematology negative hematology ROS (+)   Anesthesia Other Findings Mouth ulcers Asthma  Migraine Pituitary adenoma (HCC)  Erythema multiforme  Behcet's dz  Reproductive/Obstetrics negative OB ROS                              Anesthesia Physical Anesthesia Plan  ASA: 3  Anesthesia Plan: MAC   Post-op Pain Management:    Induction: Intravenous  PONV Risk Score and Plan:   Airway Management Planned: Natural Airway and Nasal Cannula  Additional Equipment:   Intra-op Plan:   Post-operative Plan:   Informed Consent: I have reviewed the patients History and Physical, chart, labs and discussed the procedure including the risks, benefits and alternatives for the proposed anesthesia with the patient or authorized representative who has indicated his/her understanding and acceptance.     Dental Advisory Given  Plan Discussed with: Anesthesiologist, CRNA and Surgeon  Anesthesia Plan Comments: (Patient consented for risks of anesthesia including but not limited to:  - adverse reactions to medications - damage to eyes, teeth, lips or other oral  mucosa - nerve damage due to positioning  - sore throat or hoarseness - Damage to heart, brain, nerves, lungs, other parts of body or loss of life  Patient voiced understanding.)         Anesthesia Quick Evaluation

## 2023-04-25 ENCOUNTER — Encounter: Payer: Self-pay | Admitting: Ophthalmology

## 2023-04-25 ENCOUNTER — Ambulatory Visit
Admission: RE | Admit: 2023-04-25 | Discharge: 2023-04-25 | Disposition: A | Discharge status: Home / Self Care | Payer: Medicare HMO | Attending: Ophthalmology | Admitting: Ophthalmology

## 2023-04-25 ENCOUNTER — Ambulatory Visit: Payer: Medicare HMO | Admitting: Anesthesiology

## 2023-04-25 ENCOUNTER — Other Ambulatory Visit: Payer: Self-pay

## 2023-04-25 ENCOUNTER — Encounter
Admission: RE | Disposition: A | Discharge status: Home / Self Care | Payer: Self-pay | Source: Home / Self Care | Attending: Ophthalmology

## 2023-04-25 DIAGNOSIS — H26031 Infantile and juvenile nuclear cataract, right eye: Secondary | ICD-10-CM | POA: Insufficient documentation

## 2023-04-25 DIAGNOSIS — H25041 Posterior subcapsular polar age-related cataract, right eye: Secondary | ICD-10-CM | POA: Diagnosis present

## 2023-04-25 DIAGNOSIS — J45909 Unspecified asthma, uncomplicated: Secondary | ICD-10-CM | POA: Insufficient documentation

## 2023-04-25 DIAGNOSIS — G43909 Migraine, unspecified, not intractable, without status migrainosus: Secondary | ICD-10-CM | POA: Insufficient documentation

## 2023-04-25 HISTORY — DX: Behcet's disease: M35.2

## 2023-04-25 HISTORY — PX: CATARACT EXTRACTION W/PHACO: SHX586

## 2023-04-25 LAB — POCT PREGNANCY, URINE: Preg Test, Ur: NEGATIVE

## 2023-04-25 SURGERY — PHACOEMULSIFICATION, CATARACT, WITH IOL INSERTION
Anesthesia: Monitor Anesthesia Care | Site: Eye | Laterality: Right

## 2023-04-25 MED ORDER — SIGHTPATH DOSE#1 BSS IO SOLN
INTRAOCULAR | Status: DC | PRN
  Administered 2023-04-25: 15 mL via INTRAOCULAR

## 2023-04-25 MED ORDER — TETRACAINE HCL 0.5 % OP SOLN
1.0000 [drp] | OPHTHALMIC | Status: DC | PRN
  Administered 2023-04-25 (×3): 1 [drp] via OPHTHALMIC

## 2023-04-25 MED ORDER — SIGHTPATH DOSE#1 BSS IO SOLN
INTRAOCULAR | Status: DC | PRN
  Administered 2023-04-25: 34 mL via OPHTHALMIC

## 2023-04-25 MED ORDER — FENTANYL CITRATE (PF) 100 MCG/2ML IJ SOLN
INTRAMUSCULAR | Status: DC | PRN
  Administered 2023-04-25 (×2): 50 ug via INTRAVENOUS

## 2023-04-25 MED ORDER — SIGHTPATH DOSE#1 NA HYALUR & NA CHOND-NA HYALUR IO KIT
PACK | INTRAOCULAR | Status: DC | PRN
  Administered 2023-04-25: 1 via OPHTHALMIC

## 2023-04-25 MED ORDER — ONDANSETRON HCL 4 MG/2ML IJ SOLN
INTRAMUSCULAR | Status: DC | PRN
  Administered 2023-04-25: 4 mg via INTRAVENOUS

## 2023-04-25 MED ORDER — MIDAZOLAM HCL 2 MG/2ML IJ SOLN
INTRAMUSCULAR | Status: DC | PRN
  Administered 2023-04-25: 2 mg via INTRAVENOUS

## 2023-04-25 MED ORDER — MOXIFLOXACIN HCL 0.5 % OP SOLN
OPHTHALMIC | Status: DC | PRN
  Administered 2023-04-25: .2 mL via OPHTHALMIC

## 2023-04-25 MED ORDER — SIGHTPATH DOSE#1 BSS IO SOLN
INTRAOCULAR | Status: DC | PRN
  Administered 2023-04-25: 2 mL

## 2023-04-25 MED ORDER — ARMC OPHTHALMIC DILATING DROPS
1.0000 | OPHTHALMIC | Status: DC | PRN
  Administered 2023-04-25 (×3): 1 via OPHTHALMIC

## 2023-04-25 MED ORDER — BRIMONIDINE TARTRATE-TIMOLOL 0.2-0.5 % OP SOLN
OPHTHALMIC | Status: DC | PRN
  Administered 2023-04-25: 1 [drp] via OPHTHALMIC

## 2023-04-25 MED ORDER — LACTATED RINGERS IV SOLN: INTRAVENOUS | Status: DC

## 2023-04-25 SURGICAL SUPPLY — 19 items
CANNULA ANT/CHMB 27G (MISCELLANEOUS) IMPLANT
CANNULA ANT/CHMB 27GA (MISCELLANEOUS) IMPLANT
CATARACT SUITE SIGHTPATH (MISCELLANEOUS) ×1 IMPLANT
FEE CATARACT SUITE SIGHTPATH (MISCELLANEOUS) ×1 IMPLANT
GLOVE SRG 8 PF TXTR STRL LF DI (GLOVE) ×1 IMPLANT
GLOVE SURG ENC TEXT LTX SZ7.5 (GLOVE) ×1 IMPLANT
GLOVE SURG GAMMEX PI TX LF 7.5 (GLOVE) IMPLANT
GLOVE SURG UNDER POLY LF SZ8 (GLOVE) ×1
LENS IOL TECNIS EYHANCE 20.0 (Intraocular Lens) IMPLANT
NDL FILTER BLUNT 18X1 1/2 (NEEDLE) ×1 IMPLANT
NDL RETROBULBAR .5 NSTRL (NEEDLE) IMPLANT
NEEDLE FILTER BLUNT 18X1 1/2 (NEEDLE) ×1 IMPLANT
PACK VIT ANT 23G (MISCELLANEOUS) IMPLANT
RING MALYGIN 7.0 (MISCELLANEOUS) IMPLANT
SUT ETHILON 10-0 CS-B-6CS-B-6 (SUTURE)
SUT VICRYL  9 0 (SUTURE)
SUT VICRYL 9 0 (SUTURE) IMPLANT
SUTURE EHLN 10-0 CS-B-6CS-B-6 (SUTURE) IMPLANT
SYR 3ML LL SCALE MARK (SYRINGE) ×1 IMPLANT

## 2023-04-25 NOTE — H&P (Signed)
[facility]   Primary Care Physician:  Emogene Morgan, MD Ophthalmologist: Dr. Lockie Mola  Pre-Procedure History & Physical: HPI:  [name] is a 37 y.o. female here for ophthalmic surgery.   Past Medical History:  Diagnosis Date   Asthma 2012   Behcet's disease (HCC)    Erythema multiforme    Migraine 2008   Mouth ulcers    Pituitary adenoma (HCC) 2010    Past Surgical History:  Procedure Laterality Date   CESAREAN SECTION  2019   CESAREAN SECTION  2017   CESAREAN SECTION  2002   KNEE ARTHROSCOPY WITH MEDIAL MENISECTOMY Right 09/05/2022   Procedure: KNEE ARTHROSCOPY WITH MEDIAL MENISECTOMY;  Surgeon: Juanell Fairly, MD;  Location: [facility];  Service: Orthopedics;  Laterality: Right;    Prior to Admission medications   Medication Sig Start Date End Date Taking? Authorizing Provider  HYDROcodone-acetaminophen (NORCO) 5-325 MG tablet Take 1-2 tablets by mouth every 4 (four) hours as needed for moderate pain (for post-operative pain). 09/05/22  Yes Juanell Fairly, MD  meloxicam (MOBIC) 7.5 MG tablet Take 7.5 mg by mouth daily as needed for pain.   Yes [provider]  mycophenolate (MYFORTIC) 360 MG TBEC EC tablet Take 360 mg by mouth 2 (two) times daily.   Yes [provider]  pantoprazole (PROTONIX) 40 MG tablet Take 40 mg by mouth daily.   Yes [provider]  valACYclovir (VALTREX) 500 MG tablet Take 1,000 mg by mouth daily.   Yes [provider]  fluticasone (FLONASE) 50 MCG/ACT nasal spray Place 2 sprays into both nostrils daily. Patient not taking: Reported on 03/28/2018 12/15/16 01/24/21  Menshew, Charlesetta Ivory, PA-C  metoCLOPramide (REGLAN) 5 MG tablet Take 1 tablet (5 mg total) by mouth every 8 (eight) hours as needed for nausea or vomiting. Patient not taking: Reported on 03/28/2018 09/21/17 01/24/21  Menshew, Charlesetta Ivory, PA-C    Allergies as of 04/16/2023 - Review Complete 09/05/2022  Allergen Reaction  Noted   Tomato Swelling 05/10/2015   Ibuprofen Other (See Comments) 05/01/2015   Penicillins Other (See Comments) 05/01/2015    Family History  Problem Relation Age of Onset   Hypertension Maternal Grandmother    Lung cancer Maternal Grandmother     Social History   Socioeconomic History   Marital status: Single    Spouse name: Not on file   Number of children: 4   Years of education: Not on file   Highest education level: Not on file  Occupational History   Not on file  Tobacco Use   Smoking status: Never   Smokeless tobacco: Never  Vaping Use   Vaping Use: Never used  Substance and Sexual Activity   Alcohol use: No   Drug use: No   Sexual activity: Yes    Birth control/protection: Injection  Other Topics Concern   Not on file  Social History Narrative   Lives with children   Social Determinants of Health   Financial Resource Strain: Not on file  Food Insecurity: Not on file  Transportation Needs: Not on file  Physical Activity: Not on file  Stress: Not on file  Social Connections: Not on file  Intimate Partner Violence: Not on file    Review of Systems: See HPI, otherwise negative ROS  Physical Exam: BP 126/89   Temp 98.1 F (36.7 C) (Temporal)   Resp (!) 22   Ht 5' 4.02" (1.626 m)   Wt 118 kg   SpO2 97%  BMI 44.62 kg/m  General:   Alert,  pleasant and cooperative in NAD Head:  Normocephalic and atraumatic. Lungs:  Clear to auscultation.    Heart:  Regular rate and rhythm.   Impression/Plan: [name] is here for ophthalmic surgery.  Risks, benefits, limitations, and alternatives regarding ophthalmic surgery have been reviewed with the patient.  Questions have been answered.  All parties agreeable.   Lockie Mola, MD  04/25/2023, 9:49 AM

## 2023-04-25 NOTE — Op Note (Signed)
LOCATION:  [facility]   PREOPERATIVE DIAGNOSIS:    posterior subcapsular cataract right eye.   POSTOPERATIVE DIAGNOSIS:  same PROCEDURE:  Phacoemusification with posterior chamber intraocular lens placement of the right eye   ULTRASOUND TIME: Procedure(s): CATARACT EXTRACTION PHACO AND INTRAOCULAR LENS PLACEMENT (IOC) RIGHT 0.37 00:01.4 (Right)  LENS:   Implant Name Type Inv. Item Serial No. Manufacturer Lot No. LRB No. Used Action  LENS IOL TECNIS EYHANCE 20.0 - [id number] Intraocular Lens LENS IOL TECNIS EYHANCE 20.0 [id number] [employer]  Right 1 Implanted         SURGEON:  Deirdre Evener, MD   ANESTHESIA:  Topical with tetracaine drops and 2% Xylocaine jelly, augmented with 1% preservative-free intracameral lidocaine.    COMPLICATIONS:  None.   DESCRIPTION OF PROCEDURE:  The patient was identified in the holding room and transported to the operating room and placed in the supine position under the operating microscope.  The right eye was identified as the operative eye and it was prepped and draped in the usual sterile ophthalmic fashion.   A 1 millimeter clear-corneal paracentesis was made at the 12:00 position.  0.5 ml of preservative-free 1% lidocaine was injected into the anterior chamber. The anterior chamber was filled with Viscoat viscoelastic.  A 2.4 millimeter keratome was used to make a near-clear corneal incision at the 9:00 position.  A curvilinear capsulorrhexis was made with a cystotome and capsulorrhexis forceps.  Balanced salt solution was used to hydrodissect and hydrodelineate the nucleus.   Phacoemulsification was then used in stop and chop fashion to remove the lens nucleus and epinucleus.  The remaining cortex was then removed using the irrigation and aspiration handpiece. Provisc was then placed into the capsular bag to distend it for lens placement.  A lens was then injected into the capsular bag.  The remaining viscoelastic was aspirated.    Wounds were hydrated with balanced salt solution.  The anterior chamber was inflated to a physiologic pressure with balanced salt solution.  No wound leaks were noted. Vigamox 0.2 ml of a 1mg  per ml solution was injected into the anterior chamber for a dose of 0.2 mg of intracameral antibiotic at the completion of the case.   Timolol and Brimonidine drops were applied to the eye.  The patient was taken to the recovery room in stable condition without complications of anesthesia or surgery.   Bibiana Gillean 04/25/2023, 10:24 AM

## 2023-04-25 NOTE — Transfer of Care (Signed)
Immediate Anesthesia Transfer of Care Note  Patient: [name]  Procedure(s) Performed: CATARACT EXTRACTION PHACO AND INTRAOCULAR LENS PLACEMENT (IOC) RIGHT 0.37 00:01.4 (Right: Eye)  Patient Location: PACU  Anesthesia Type: MAC  Level of Consciousness: awake, alert  and patient cooperative  Airway and Oxygen Therapy: Patient Spontanous Breathing and Patient connected to supplemental oxygen  Post-op Assessment: Post-op Vital signs reviewed, Patient's Cardiovascular Status Stable, Respiratory Function Stable, Patent Airway and No signs of Nausea or vomiting  Post-op Vital Signs: Reviewed and stable  Complications: No notable events documented.

## 2023-04-25 NOTE — Anesthesia Postprocedure Evaluation (Signed)
Anesthesia Post Note  Patient: Princess Perna  Procedure(s) Performed: CATARACT EXTRACTION PHACO AND INTRAOCULAR LENS PLACEMENT (IOC) RIGHT 0.37 00:01.4 (Right: Eye)  Patient location during evaluation: PACU Anesthesia Type: MAC Level of consciousness: awake and alert Pain management: pain level controlled Vital Signs Assessment: post-procedure vital signs reviewed and stable Respiratory status: spontaneous breathing, nonlabored ventilation, respiratory function stable and patient connected to nasal cannula oxygen Cardiovascular status: stable and blood pressure returned to baseline Postop Assessment: no apparent nausea or vomiting Anesthetic complications: no   No notable events documented.   Last Vitals:  Vitals:   04/25/23 1025 04/25/23 1032  BP: 110/76 117/82  Pulse: 92   Resp: (!) 24 (!) 24  Temp: 36.6 C (!) 36.4 C  SpO2: 99% (!) 1%    Last Pain:  Vitals:   04/25/23 1032  TempSrc:   PainSc: 0-No pain                 Keiondre Colee C Jaece Ducharme

## 2023-04-25 NOTE — Discharge Instructions (Signed)

## 2023-04-26 ENCOUNTER — Encounter: Payer: Self-pay | Admitting: Ophthalmology

## 2023-05-03 NOTE — Discharge Instructions (Signed)

## 2023-05-04 NOTE — Anesthesia Preprocedure Evaluation (Signed)
Anesthesia Evaluation  Patient identified by MRN, date of birth, ID band Patient awake    Reviewed: Allergy & Precautions, H&P , NPO status , Patient's Chart, lab work & pertinent test results  Airway Mallampati: II  TM Distance: >3 FB Neck ROM: Full    Dental no notable dental hx.    Pulmonary neg pulmonary ROS, asthma    Pulmonary exam normal breath sounds clear to auscultation       Cardiovascular negative cardio ROS Normal cardiovascular exam Rhythm:Regular Rate:Normal     Neuro/Psych  Headaches negative neurological ROS  negative psych ROS   GI/Hepatic negative GI ROS, Neg liver ROS,,,  Endo/Other  negative endocrine ROS    Renal/GU negative Renal ROS  negative genitourinary   Musculoskeletal negative musculoskeletal ROS (+)    Abdominal   Peds negative pediatric ROS (+)  Hematology negative hematology ROS (+)   Anesthesia Other Findings Mouth ulcers  Asthma Migraine  Pituitary adenoma (HCC) Erythema multiforme  Behcet's disease (HCC)    Reproductive/Obstetrics negative OB ROS                              Anesthesia Physical Anesthesia Plan  ASA: 3  Anesthesia Plan: MAC   Post-op Pain Management:    Induction: Intravenous  PONV Risk Score and Plan:   Airway Management Planned: Natural Airway and Nasal Cannula  Additional Equipment:   Intra-op Plan:   Post-operative Plan:   Informed Consent: I have reviewed the patients History and Physical, chart, labs and discussed the procedure including the risks, benefits and alternatives for the proposed anesthesia with the patient or authorized representative who has indicated his/her understanding and acceptance.     Dental Advisory Given  Plan Discussed with: Anesthesiologist, CRNA and Surgeon  Anesthesia Plan Comments: (Patient consented for risks of anesthesia including but not limited to:  - adverse reactions  to medications - damage to eyes, teeth, lips or other oral mucosa - nerve damage due to positioning  - sore throat or hoarseness - Damage to heart, brain, nerves, lungs, other parts of body or loss of life  Patient voiced understanding.)         Anesthesia Quick Evaluation

## 2023-05-09 ENCOUNTER — Ambulatory Visit
Admission: RE | Admit: 2023-05-09 | Discharge: 2023-05-09 | Disposition: A | Discharge status: Home / Self Care | Payer: Medicare HMO | Attending: Ophthalmology | Admitting: Ophthalmology

## 2023-05-09 ENCOUNTER — Ambulatory Visit: Payer: Medicare HMO | Admitting: Anesthesiology

## 2023-05-09 ENCOUNTER — Encounter
Admission: RE | Disposition: A | Discharge status: Home / Self Care | Payer: Self-pay | Source: Home / Self Care | Attending: Ophthalmology

## 2023-05-09 ENCOUNTER — Other Ambulatory Visit: Payer: Self-pay

## 2023-05-09 ENCOUNTER — Encounter: Payer: Self-pay | Admitting: Ophthalmology

## 2023-05-09 DIAGNOSIS — H25042 Posterior subcapsular polar age-related cataract, left eye: Secondary | ICD-10-CM | POA: Diagnosis present

## 2023-05-09 DIAGNOSIS — H26052 Posterior subcapsular polar infantile and juvenile cataract, left eye: Secondary | ICD-10-CM | POA: Insufficient documentation

## 2023-05-09 HISTORY — PX: CATARACT EXTRACTION W/PHACO: SHX586

## 2023-05-09 LAB — POCT PREGNANCY, URINE: Preg Test, Ur: NEGATIVE

## 2023-05-09 SURGERY — PHACOEMULSIFICATION, CATARACT, WITH IOL INSERTION
Anesthesia: Monitor Anesthesia Care | Site: Eye | Laterality: Left

## 2023-05-09 MED ORDER — MIDAZOLAM HCL 2 MG/2ML IJ SOLN
INTRAMUSCULAR | Status: DC | PRN
  Administered 2023-05-09: 2 mg via INTRAVENOUS

## 2023-05-09 MED ORDER — SIGHTPATH DOSE#1 BSS IO SOLN
INTRAOCULAR | Status: DC | PRN
  Administered 2023-05-09: 2 mL

## 2023-05-09 MED ORDER — TETRACAINE HCL 0.5 % OP SOLN
1.0000 [drp] | OPHTHALMIC | Status: DC | PRN
  Administered 2023-05-09 (×3): 1 [drp] via OPHTHALMIC

## 2023-05-09 MED ORDER — ARMC OPHTHALMIC DILATING DROPS
1.0000 | OPHTHALMIC | Status: DC | PRN
  Administered 2023-05-09 (×3): 1 via OPHTHALMIC

## 2023-05-09 MED ORDER — SODIUM CHLORIDE 0.9% FLUSH
INTRAVENOUS | Status: DC | PRN
  Administered 2023-05-09: 10 mL via INTRAVENOUS

## 2023-05-09 MED ORDER — FENTANYL CITRATE (PF) 100 MCG/2ML IJ SOLN
INTRAMUSCULAR | Status: DC | PRN
  Administered 2023-05-09: 50 ug via INTRAVENOUS

## 2023-05-09 MED ORDER — SIGHTPATH DOSE#1 BSS IO SOLN
INTRAOCULAR | Status: DC | PRN
  Administered 2023-05-09: 31 mL via OPHTHALMIC

## 2023-05-09 MED ORDER — BRIMONIDINE TARTRATE-TIMOLOL 0.2-0.5 % OP SOLN
OPHTHALMIC | Status: DC | PRN
  Administered 2023-05-09: 1 [drp] via OPHTHALMIC

## 2023-05-09 MED ORDER — SIGHTPATH DOSE#1 BSS IO SOLN
INTRAOCULAR | Status: DC | PRN
  Administered 2023-05-09: 15 mL via INTRAOCULAR

## 2023-05-09 MED ORDER — MOXIFLOXACIN HCL 0.5 % OP SOLN
OPHTHALMIC | Status: DC | PRN
  Administered 2023-05-09: .2 mL via OPHTHALMIC

## 2023-05-09 MED ORDER — SIGHTPATH DOSE#1 NA HYALUR & NA CHOND-NA HYALUR IO KIT
PACK | INTRAOCULAR | Status: DC | PRN
  Administered 2023-05-09: 1 via OPHTHALMIC

## 2023-05-09 SURGICAL SUPPLY — 19 items
CANNULA ANT/CHMB 27G (MISCELLANEOUS) IMPLANT
CANNULA ANT/CHMB 27GA (MISCELLANEOUS) IMPLANT
CATARACT SUITE SIGHTPATH (MISCELLANEOUS) ×1 IMPLANT
FEE CATARACT SUITE SIGHTPATH (MISCELLANEOUS) ×1 IMPLANT
GLOVE SRG 8 PF TXTR STRL LF DI (GLOVE) ×1 IMPLANT
GLOVE SURG ENC TEXT LTX SZ7.5 (GLOVE) ×1 IMPLANT
GLOVE SURG GAMMEX PI TX LF 7.5 (GLOVE) IMPLANT
GLOVE SURG UNDER POLY LF SZ8 (GLOVE) ×1
LENS IOL TECNIS EYHANCE 20.0 (Intraocular Lens) IMPLANT
NDL FILTER BLUNT 18X1 1/2 (NEEDLE) ×1 IMPLANT
NDL RETROBULBAR .5 NSTRL (NEEDLE) IMPLANT
NEEDLE FILTER BLUNT 18X1 1/2 (NEEDLE) ×1 IMPLANT
PACK VIT ANT 23G (MISCELLANEOUS) IMPLANT
RING MALYGIN 7.0 (MISCELLANEOUS) IMPLANT
SUT ETHILON 10-0 CS-B-6CS-B-6 (SUTURE)
SUT VICRYL  9 0 (SUTURE)
SUT VICRYL 9 0 (SUTURE) IMPLANT
SUTURE EHLN 10-0 CS-B-6CS-B-6 (SUTURE) IMPLANT
SYR 3ML LL SCALE MARK (SYRINGE) ×1 IMPLANT

## 2023-05-09 NOTE — Transfer of Care (Signed)
Immediate Anesthesia Transfer of Care Note  Patient: [name]  Procedure(s) Performed: CATARACT EXTRACTION PHACO AND INTRAOCULAR LENS PLACEMENT (IOC) LEFT 0.72 00:16.7 (Left: Eye)  Patient Location: PACU  Anesthesia Type:MAC  Level of Consciousness: awake, alert , and oriented  Airway & Oxygen Therapy: Patient Spontanous Breathing  Post-op Assessment: Report given to RN and Post -op Vital signs reviewed and stable  Post vital signs: Reviewed and stable  Last Vitals: SEE PACU FLOW SHEET Vitals Value Taken Time  BP    Temp    Pulse 82 05/09/23 0919  Resp 15 05/09/23 0919  SpO2 100 % 05/09/23 0919  Vitals shown include unvalidated device data.  Last Pain:  Vitals:   05/09/23 0814  TempSrc: Temporal  PainSc: 0-No pain         Complications: No notable events documented.

## 2023-05-09 NOTE — Op Note (Signed)
OPERATIVE NOTE  ALONDA BUSSEY [id number] 05/09/2023   PREOPERATIVE DIAGNOSIS:  presenile Posterior Subcapsular Cataract Left Eye  H25.042    POSTOPERATIVE DIAGNOSIS:   same    PROCEDURE:  Phacoemusification with posterior chamber intraocular lens placement of the left eye  Ultrasound time: Procedure(s): CATARACT EXTRACTION PHACO AND INTRAOCULAR LENS PLACEMENT (IOC) LEFT 0.72 00:16.7 (Left)  LENS:   Implant Name Type Inv. Item Serial No. Manufacturer Lot No. LRB No. Used Action  LENS IOL TECNIS EYHANCE 20.0 - [id number] Intraocular Lens LENS IOL TECNIS EYHANCE 20.0 [id number] [employer]  Left 1 Implanted      SURGEON:  Deirdre Evener, MD   ANESTHESIA:  Topical with tetracaine drops and 2% Xylocaine jelly, augmented with 1% preservative-free intracameral lidocaine.    COMPLICATIONS:  None.   DESCRIPTION OF PROCEDURE:  The patient was identified in the holding room and transported to the operating room and placed in the supine position under the operating microscope.  The left eye was identified as the operative eye and it was prepped and draped in the usual sterile ophthalmic fashion.   A 1 millimeter clear-corneal paracentesis was made at the 1:30 position.  0.5 ml of preservative-free 1% lidocaine was injected into the anterior chamber.  The anterior chamber was filled with Viscoat viscoelastic.  A 2.4 millimeter keratome was used to make a near-clear corneal incision at the 10:30 position.  .  A curvilinear capsulorrhexis was made with a cystotome and capsulorrhexis forceps.  Balanced salt solution was used to hydrodissect and hydrodelineate the nucleus.   Phacoemulsification was then used in stop and chop fashion to remove the lens nucleus and epinucleus.  The remaining cortex was then removed using the irrigation and aspiration handpiece. Provisc was then placed into the capsular bag to distend it for lens placement.  A lens was then injected into the capsular bag.  The  remaining viscoelastic was aspirated.   Wounds were hydrated with balanced salt solution.  The anterior chamber was inflated to a physiologic pressure with balanced salt solution.  No wound leaks were noted. Vigamox 0.2 ml of a 1mg  per ml solution was injected into the anterior chamber for a dose of 0.2 mg of intracameral antibiotic at the completion of the case.   Timolol and Brimonidine drops were applied to the eye.  The patient was taken to the recovery room in stable condition without complications of anesthesia or surgery.  Sonny Poth 05/09/2023, 9:17 AM

## 2023-05-09 NOTE — H&P (Signed)
[facility]   Primary Care Physician:  Emogene Morgan, MD Ophthalmologist: Dr. Lockie Mola  Pre-Procedure History & Physical: HPI:  [name] is a 37 y.o. female here for ophthalmic surgery.   Past Medical History:  Diagnosis Date   Asthma 2012   Behcet's disease (HCC)    Erythema multiforme    Migraine 2008   Mouth ulcers    Pituitary adenoma (HCC) 2010    Past Surgical History:  Procedure Laterality Date   CATARACT EXTRACTION W/PHACO Right 04/25/2023   Procedure: CATARACT EXTRACTION PHACO AND INTRAOCULAR LENS PLACEMENT (IOC) RIGHT 0.37 00:01.4;  Surgeon: Lockie Mola, MD;  Location: [facility];  Service: Ophthalmology;  Laterality: Right;   CESAREAN SECTION  2019   CESAREAN SECTION  2017   CESAREAN SECTION  2002   KNEE ARTHROSCOPY WITH MEDIAL MENISECTOMY Right 09/05/2022   Procedure: KNEE ARTHROSCOPY WITH MEDIAL MENISECTOMY;  Surgeon: Juanell Fairly, MD;  Location: [facility];  Service: Orthopedics;  Laterality: Right;    Prior to Admission medications   Medication Sig Start Date End Date Taking? Authorizing Provider  HYDROcodone-acetaminophen (NORCO) 5-325 MG tablet Take 1-2 tablets by mouth every 4 (four) hours as needed for moderate pain (for post-operative pain). 09/05/22  Yes Juanell Fairly, MD  mycophenolate (MYFORTIC) 360 MG TBEC EC tablet Take 360 mg by mouth 2 (two) times daily.   Yes [provider]  pantoprazole (PROTONIX) 40 MG tablet Take 40 mg by mouth daily.   Yes [provider]  valACYclovir (VALTREX) 500 MG tablet Take 1,000 mg by mouth daily.   Yes [provider]  meloxicam (MOBIC) 7.5 MG tablet Take 7.5 mg by mouth daily as needed for pain. Patient not taking: Reported on 05/09/2023    [provider]  fluticasone (FLONASE) 50 MCG/ACT nasal spray Place 2 sprays into both nostrils daily. Patient not taking: Reported on 03/28/2018 12/15/16 01/24/21  Menshew, Charlesetta Ivory, PA-C   metoCLOPramide (REGLAN) 5 MG tablet Take 1 tablet (5 mg total) by mouth every 8 (eight) hours as needed for nausea or vomiting. Patient not taking: Reported on 03/28/2018 09/21/17 01/24/21  Menshew, Charlesetta Ivory, PA-C    Allergies as of 04/16/2023 - Review Complete 09/05/2022  Allergen Reaction Noted   Tomato Swelling 05/10/2015   Ibuprofen Other (See Comments) 05/01/2015   Penicillins Other (See Comments) 05/01/2015    Family History  Problem Relation Age of Onset   Hypertension Maternal Grandmother    Lung cancer Maternal Grandmother     Social History   Socioeconomic History   Marital status: Single    Spouse name: Not on file   Number of children: 4   Years of education: Not on file   Highest education level: Not on file  Occupational History   Not on file  Tobacco Use   Smoking status: Never   Smokeless tobacco: Never  Vaping Use   Vaping Use: Never used  Substance and Sexual Activity   Alcohol use: No   Drug use: No   Sexual activity: Yes    Birth control/protection: Injection  Other Topics Concern   Not on file  Social History Narrative   Lives with children   Social Determinants of Health   Financial Resource Strain: Not on file  Food Insecurity: Not on file  Transportation Needs: Not on file  Physical Activity: Not on file  Stress: Not on file  Social Connections: Not on file  Intimate Partner Violence: Not on file  Review of Systems: See HPI, otherwise negative ROS  Physical Exam: BP (!) 146/100   Pulse 73   Temp (!) 97.5 F (36.4 C) (Temporal)   Resp (!) 21   Ht 5\' 4"  (1.626 m)   Wt 117.8 kg   SpO2 98%   Breastfeeding No   BMI 44.58 kg/m  General:   Alert,  pleasant and cooperative in NAD Head:  Normocephalic and atraumatic. Lungs:  Clear to auscultation.    Heart:  Regular rate and rhythm.   Impression/Plan: [name] is here for ophthalmic surgery.  Risks, benefits, limitations, and alternatives regarding ophthalmic  surgery have been reviewed with the patient.  Questions have been answered.  All parties agreeable.   Lockie Mola, MD  05/09/2023, 8:40 AM

## 2023-05-09 NOTE — Anesthesia Postprocedure Evaluation (Signed)
Anesthesia Post Note  Patient: [name]  Procedure(s) Performed: CATARACT EXTRACTION PHACO AND INTRAOCULAR LENS PLACEMENT (IOC) LEFT 0.72 00:16.7 (Left: Eye)  Patient location during evaluation: PACU Anesthesia Type: MAC Level of consciousness: awake and alert Pain management: pain level controlled Vital Signs Assessment: post-procedure vital signs reviewed and stable Respiratory status: spontaneous breathing, nonlabored ventilation, respiratory function stable and patient connected to nasal cannula oxygen Cardiovascular status: stable and blood pressure returned to baseline Postop Assessment: no apparent nausea or vomiting Anesthetic complications: no   No notable events documented.   Last Vitals:  Vitals:   05/09/23 0919 05/09/23 0923  BP: (!) 125/95 (!) 139/100  Pulse: 82 76  Resp: 15 (!) 22  Temp: (!) 36.2 C (!) 36.4 C  SpO2: 100% 100%    Last Pain:  Vitals:   05/09/23 0923  TempSrc:   PainSc: 0-No pain                 Marisue Humble

## 2023-05-10 ENCOUNTER — Encounter: Payer: Self-pay | Admitting: Ophthalmology

## 2024-07-16 ENCOUNTER — Other Ambulatory Visit: Payer: Self-pay | Admitting: Podiatry

## 2024-07-16 DIAGNOSIS — G5751 Tarsal tunnel syndrome, right lower limb: Secondary | ICD-10-CM

## 2024-07-16 DIAGNOSIS — M62461 Contracture of muscle, right lower leg: Secondary | ICD-10-CM

## 2024-07-16 DIAGNOSIS — M722 Plantar fascial fibromatosis: Secondary | ICD-10-CM

## 2024-07-17 ENCOUNTER — Encounter: Payer: Self-pay | Admitting: Podiatry

## 2024-07-18 ENCOUNTER — Ambulatory Visit
Admission: RE | Admit: 2024-07-18 | Discharge: 2024-07-18 | Disposition: A | Discharge status: Home / Self Care | Source: Ambulatory Visit | Attending: Podiatry | Admitting: Podiatry

## 2024-07-18 DIAGNOSIS — M62461 Contracture of muscle, right lower leg: Secondary | ICD-10-CM

## 2024-07-18 DIAGNOSIS — M722 Plantar fascial fibromatosis: Secondary | ICD-10-CM

## 2024-07-18 DIAGNOSIS — G5751 Tarsal tunnel syndrome, right lower limb: Secondary | ICD-10-CM

## 2024-08-12 ENCOUNTER — Other Ambulatory Visit: Payer: Self-pay | Admitting: Podiatry

## 2024-08-13 ENCOUNTER — Other Ambulatory Visit: Payer: Self-pay

## 2024-08-13 ENCOUNTER — Encounter
Admission: RE | Admit: 2024-08-13 | Discharge: 2024-08-13 | Disposition: A | Discharge status: Home / Self Care | Source: Ambulatory Visit | Attending: Podiatry | Admitting: Podiatry

## 2024-08-13 DIAGNOSIS — Z01812 Encounter for preprocedural laboratory examination: Secondary | ICD-10-CM

## 2024-08-13 HISTORY — DX: Unspecified osteoarthritis, unspecified site: M19.90

## 2024-08-13 HISTORY — DX: Dyspnea, unspecified: R06.00

## 2024-08-13 HISTORY — DX: Plantar fascial fibromatosis: M72.2

## 2024-08-13 HISTORY — DX: Contracture of muscle, right lower leg: M62.461

## 2024-08-13 HISTORY — DX: Tarsal tunnel syndrome, right lower limb: G57.51

## 2024-08-13 HISTORY — DX: Pneumonia, unspecified organism: J18.9

## 2024-08-13 NOTE — Patient Instructions (Addendum)
 Your procedure is scheduled on: 08/22/24 - Friday Report to the Registration Desk on the 1st floor of the [facility]. To find out your arrival time, please call [phone] between 1PM - 3PM on: 08/21/24 - Thursday If your arrival time is 6:00 am, do not arrive before that time as the [facility] entrance doors do not open until 6:00 am.  REMEMBER: Instructions that are not followed completely may result in serious medical risk, up to and including death; or upon the discretion of your surgeon and anesthesiologist your surgery may need to be rescheduled.  Do not eat food after midnight the night before surgery.  No gum chewing or hard candies.  You may however, drink CLEAR liquids up to 2 hours before you are scheduled to arrive for your surgery. Do not drink anything within 2 hours of your scheduled arrival time.  Clear liquids include: - water  - apple juice without pulp - gatorade (not RED colors) - black coffee or tea (Do NOT add milk or creamers to the coffee or tea) Do NOT drink anything that is not on this list.  In addition, your doctor has ordered for you to drink the provided:  Ensure Pre-Surgery Clear Carbohydrate Drink  Drinking this carbohydrate drink up to two hours before surgery helps to reduce insulin resistance and improve patient outcomes. Please complete drinking 2 - 3 hours before scheduled arrival time.  One week prior to surgery: Stop Anti-inflammatories (NSAIDS) such as Advil, Aleve, Ibuprofen, Motrin, Naproxen, Naprosyn and Aspirin  based products such as Excedrin, Goody's Powder, BC Powder. You may take Tylenol  if needed for pain up until the day of surgery.  Stop ANY OVER THE COUNTER supplements until after surgery.  ON THE DAY OF SURGERY ONLY TAKE THESE MEDICATIONS WITH SIPS OF WATER:  mycophenolate (MYFORTIC)  valACYclovir (VALTREX)    No Alcohol for 24 hours before or after surgery.  No Smoking including e-cigarettes for 24 hours before surgery.   No chewable tobacco products for at least 6 hours before surgery.  No nicotine patches on the day of surgery.  Do not use any recreational drugs for at least a week (preferably 2 weeks) before your surgery.  Please be advised that the combination of cocaine and anesthesia may have negative outcomes, up to and including death. If you test positive for cocaine, your surgery will be cancelled.  On the morning of surgery brush your teeth with toothpaste and water, you may rinse your mouth with mouthwash if you wish. Do not swallow any toothpaste or mouthwash.  Use CHG Soap or wipes as directed on instruction sheet.  Do not wear jewelry, make-up, hairpins, clips or nail polish.  For welded (permanent) jewelry: bracelets, anklets, waist bands, etc.  Please have this removed prior to surgery.  If it is not removed, there is a chance that hospital personnel will need to cut it off on the day of surgery.  Do not wear lotions, powders, or perfumes.   Do not shave body hair from the neck down 48 hours before surgery.  Contact lenses, hearing aids and dentures may not be worn into surgery.  Do not bring valuables to the hospital. [facility] is not responsible for any missing/lost belongings or valuables.   Notify your doctor if there is any change in your medical condition (cold, fever, infection).  Wear comfortable clothing (specific to your surgery type) to the hospital.  After surgery, you can help prevent lung complications by doing breathing exercises.  Take  deep breaths and cough every 1-2 hours. Your doctor may order a device called an Incentive Spirometer to help you take deep breaths.  When coughing or sneezing, hold a pillow firmly against your incision with both hands. This is called "splinting." Doing this helps protect your incision. It also decreases belly discomfort.  If you are being admitted to the hospital overnight, leave your suitcase in the car. After surgery it may be  brought to your room.  In case of increased patient census, it may be necessary for you, the patient, to continue your postoperative care in the Same Day Surgery department.  If you are being discharged the day of surgery, you will not be allowed to drive home. You will need a responsible individual to drive you home and stay with you for 24 hours after surgery.   If you are taking public transportation, you will need to have a responsible individual with you.  Please call the Pre-admissions Testing Dept. at [phone] if you have any questions about these instructions.  Surgery Visitation Policy:  Patients having surgery or a procedure may have two visitors.  Children under the age of 88 must have an adult with them who is not the patient.  Inpatient Visitation:    Visiting hours are 7 a.m. to 8 p.m. Up to four visitors are allowed at one time in a patient room. The visitors may rotate out with other people during the day.  One visitor age 16 or older may stay with the patient overnight and must be in the room by 8 p.m.   Merchandiser, retail to address health-related social needs:  [email or web address]

## 2024-08-21 MED ORDER — CHLORHEXIDINE GLUCONATE 0.12 % MT SOLN
15.0000 mL | Freq: Once | OROMUCOSAL | Status: AC
Start: 2024-08-21 — End: 2024-08-22
  Administered 2024-08-22: 15 mL via OROMUCOSAL

## 2024-08-21 MED ORDER — ORAL CARE MOUTH RINSE
15.0000 mL | Freq: Once | OROMUCOSAL | Status: AC
Start: 2024-08-21 — End: 2024-08-22

## 2024-08-21 MED ORDER — LACTATED RINGERS IV SOLN: INTRAVENOUS | Status: DC

## 2024-08-21 MED ORDER — CEFAZOLIN SODIUM-DEXTROSE 2-4 GM/100ML-% IV SOLN: 2.0000 g | INTRAVENOUS | Status: DC

## 2024-08-22 ENCOUNTER — Ambulatory Visit

## 2024-08-22 ENCOUNTER — Ambulatory Visit
Admission: RE | Admit: 2024-08-22 | Discharge: 2024-08-22 | Disposition: A | Discharge status: Home / Self Care | Attending: Podiatry | Admitting: Podiatry

## 2024-08-22 ENCOUNTER — Encounter
Admission: RE | Disposition: A | Discharge status: Home / Self Care | Payer: Self-pay | Source: Home / Self Care | Attending: Podiatry

## 2024-08-22 ENCOUNTER — Other Ambulatory Visit: Payer: Self-pay

## 2024-08-22 ENCOUNTER — Encounter: Payer: Self-pay | Admitting: Podiatry

## 2024-08-22 DIAGNOSIS — J45909 Unspecified asthma, uncomplicated: Secondary | ICD-10-CM | POA: Insufficient documentation

## 2024-08-22 DIAGNOSIS — K219 Gastro-esophageal reflux disease without esophagitis: Secondary | ICD-10-CM | POA: Insufficient documentation

## 2024-08-22 DIAGNOSIS — E66813 Obesity, class 3: Secondary | ICD-10-CM | POA: Insufficient documentation

## 2024-08-22 DIAGNOSIS — Z79899 Other long term (current) drug therapy: Secondary | ICD-10-CM | POA: Insufficient documentation

## 2024-08-22 DIAGNOSIS — Z01812 Encounter for preprocedural laboratory examination: Secondary | ICD-10-CM

## 2024-08-22 DIAGNOSIS — Z6841 Body Mass Index (BMI) 40.0 and over, adult: Secondary | ICD-10-CM | POA: Diagnosis not present

## 2024-08-22 DIAGNOSIS — G5751 Tarsal tunnel syndrome, right lower limb: Secondary | ICD-10-CM | POA: Insufficient documentation

## 2024-08-22 DIAGNOSIS — M722 Plantar fascial fibromatosis: Secondary | ICD-10-CM | POA: Insufficient documentation

## 2024-08-22 DIAGNOSIS — M62461 Contracture of muscle, right lower leg: Secondary | ICD-10-CM | POA: Insufficient documentation

## 2024-08-22 HISTORY — PX: PLANTAR FASCIA RELEASE: SHX2239

## 2024-08-22 HISTORY — PX: GASTROC RECESSION EXTREMITY: SHX6262

## 2024-08-22 HISTORY — PX: NERVE REPAIR: SHX2083

## 2024-08-22 LAB — POCT PREGNANCY, URINE: Preg Test, Ur: NEGATIVE

## 2024-08-22 SURGERY — RECESSION, TENDON, GASTROCNEMIUS
Anesthesia: General | Site: Leg Lower | Laterality: Right

## 2024-08-22 MED ORDER — FENTANYL CITRATE (PF) 100 MCG/2ML IJ SOLN
25.0000 ug | INTRAMUSCULAR | Status: DC | PRN
  Administered 2024-08-22: 50 ug via INTRAVENOUS
  Administered 2024-08-22: 25 ug via INTRAVENOUS

## 2024-08-22 MED ORDER — PROPOFOL 10 MG/ML IV BOLUS
INTRAVENOUS | Status: DC | PRN
  Administered 2024-08-22: 250 mg via INTRAVENOUS

## 2024-08-22 MED ORDER — DIPHENHYDRAMINE HCL 50 MG/ML IJ SOLN
INTRAMUSCULAR | Status: DC | PRN
  Administered 2024-08-22: 12.5 mg via INTRAVENOUS

## 2024-08-22 MED ORDER — PHENYLEPHRINE 80 MCG/ML (10ML) SYRINGE FOR IV PUSH (FOR BLOOD PRESSURE SUPPORT)
PREFILLED_SYRINGE | INTRAVENOUS | Status: AC
Start: 2024-08-22 — End: 2024-08-22
  Filled 2024-08-22: qty 10

## 2024-08-22 MED ORDER — CEFAZOLIN SODIUM-DEXTROSE 2-4 GM/100ML-% IV SOLN
INTRAVENOUS | Status: AC
  Filled 2024-08-22: qty 100

## 2024-08-22 MED ORDER — BUPIVACAINE HCL (PF) 0.25 % IJ SOLN
INTRAMUSCULAR | Status: AC
  Filled 2024-08-22: qty 30

## 2024-08-22 MED ORDER — ONDANSETRON HCL 4 MG PO TABS: 4.0000 mg | ORAL_TABLET | Freq: Four times a day (QID) | ORAL | Status: DC | PRN

## 2024-08-22 MED ORDER — FENTANYL CITRATE (PF) 100 MCG/2ML IJ SOLN
INTRAMUSCULAR | Status: AC
  Filled 2024-08-22: qty 2

## 2024-08-22 MED ORDER — KETAMINE HCL 50 MG/5ML IJ SOSY
PREFILLED_SYRINGE | INTRAMUSCULAR | Status: DC | PRN
  Administered 2024-08-22: 50 mg via INTRAVENOUS

## 2024-08-22 MED ORDER — OXYCODONE-ACETAMINOPHEN 5-325 MG PO TABS: 1.0000 | ORAL_TABLET | Freq: Four times a day (QID) | ORAL | 0 refills | Status: AC | PRN

## 2024-08-22 MED ORDER — MIDAZOLAM HCL 2 MG/2ML IJ SOLN
INTRAMUSCULAR | Status: AC
  Filled 2024-08-22: qty 2

## 2024-08-22 MED ORDER — SUCCINYLCHOLINE CHLORIDE 200 MG/10ML IV SOSY
PREFILLED_SYRINGE | INTRAVENOUS | Status: DC | PRN
  Administered 2024-08-22: 160 mg via INTRAVENOUS

## 2024-08-22 MED ORDER — DEXMEDETOMIDINE HCL IN NACL 80 MCG/20ML IV SOLN
INTRAVENOUS | Status: AC
  Filled 2024-08-22: qty 20

## 2024-08-22 MED ORDER — HYDROCORTISONE SOD SUC (PF) 100 MG IJ SOLR
INTRAMUSCULAR | Status: AC
  Filled 2024-08-22: qty 2

## 2024-08-22 MED ORDER — HYDROCORTISONE SOD SUC (PF) 100 MG IJ SOLR
INTRAMUSCULAR | Status: DC | PRN
  Administered 2024-08-22: 100 mg via INTRAVENOUS

## 2024-08-22 MED ORDER — CHLORHEXIDINE GLUCONATE 0.12 % MT SOLN
OROMUCOSAL | Status: AC
Start: 2024-08-22 — End: 2024-08-22
  Filled 2024-08-22: qty 15

## 2024-08-22 MED ORDER — GLYCOPYRROLATE 0.2 MG/ML IJ SOLN
INTRAMUSCULAR | Status: DC | PRN
  Administered 2024-08-22: .1 mg via INTRAVENOUS

## 2024-08-22 MED ORDER — LIDOCAINE HCL (PF) 1 % IJ SOLN
INTRAMUSCULAR | Status: AC
  Filled 2024-08-22: qty 30

## 2024-08-22 MED ORDER — MIDAZOLAM HCL 2 MG/2ML IJ SOLN
INTRAMUSCULAR | Status: DC | PRN
  Administered 2024-08-22: 2 mg via INTRAVENOUS

## 2024-08-22 MED ORDER — METOCLOPRAMIDE HCL 10 MG PO TABS: 5.0000 mg | ORAL_TABLET | Freq: Three times a day (TID) | ORAL | Status: DC | PRN

## 2024-08-22 MED ORDER — FENTANYL CITRATE (PF) 100 MCG/2ML IJ SOLN
INTRAMUSCULAR | Status: DC | PRN
  Administered 2024-08-22 (×2): 50 ug via INTRAVENOUS

## 2024-08-22 MED ORDER — PHENYLEPHRINE 80 MCG/ML (10ML) SYRINGE FOR IV PUSH (FOR BLOOD PRESSURE SUPPORT)
PREFILLED_SYRINGE | INTRAVENOUS | Status: DC | PRN
  Administered 2024-08-22: 80 ug via INTRAVENOUS

## 2024-08-22 MED ORDER — 0.9 % SODIUM CHLORIDE (POUR BTL) OPTIME
TOPICAL | Status: DC | PRN
  Administered 2024-08-22: 500 mL

## 2024-08-22 MED ORDER — LIDOCAINE HCL (CARDIAC) PF 100 MG/5ML IV SOSY
PREFILLED_SYRINGE | INTRAVENOUS | Status: DC | PRN
  Administered 2024-08-22: 100 mg via INTRAVENOUS

## 2024-08-22 MED ORDER — BUPIVACAINE HCL (PF) 0.5 % IJ SOLN
INTRAMUSCULAR | Status: AC
  Filled 2024-08-22: qty 30

## 2024-08-22 MED ORDER — KETAMINE HCL 50 MG/5ML IJ SOSY
PREFILLED_SYRINGE | INTRAMUSCULAR | Status: AC
  Filled 2024-08-22: qty 5

## 2024-08-22 MED ORDER — DEXMEDETOMIDINE HCL IN NACL 80 MCG/20ML IV SOLN
INTRAVENOUS | Status: DC | PRN
  Administered 2024-08-22 (×2): 4 ug via INTRAVENOUS
  Administered 2024-08-22 (×2): 8 ug via INTRAVENOUS

## 2024-08-22 MED ORDER — LIDOCAINE HCL (PF) 2 % IJ SOLN
INTRAMUSCULAR | Status: AC
  Filled 2024-08-22: qty 5

## 2024-08-22 MED ORDER — ONDANSETRON HCL 4 MG/2ML IJ SOLN: 4.0000 mg | Freq: Four times a day (QID) | INTRAMUSCULAR | Status: DC | PRN

## 2024-08-22 MED ORDER — ONDANSETRON HCL 4 MG/2ML IJ SOLN
INTRAMUSCULAR | Status: DC | PRN
  Administered 2024-08-22: 4 mg via INTRAVENOUS

## 2024-08-22 MED ORDER — CEFAZOLIN SODIUM 1 G IJ SOLR: 3000.0000 mg | Freq: Once | INTRAMUSCULAR | Status: DC

## 2024-08-22 MED ORDER — ASPIRIN 325 MG PO TBEC: 325.0000 mg | DELAYED_RELEASE_TABLET | Freq: Two times a day (BID) | ORAL | 0 refills | Status: AC

## 2024-08-22 MED ORDER — CEFAZOLIN SODIUM-DEXTROSE 3-4 GM/150ML-% IV SOLN
3.0000 g | Freq: Once | INTRAVENOUS | Status: AC
  Administered 2024-08-22: 3 g via INTRAVENOUS
  Filled 2024-08-22: qty 150

## 2024-08-22 MED ORDER — DROPERIDOL 2.5 MG/ML IJ SOLN: 0.6250 mg | Freq: Once | INTRAMUSCULAR | Status: DC | PRN

## 2024-08-22 MED ORDER — ALBUTEROL SULFATE HFA 108 (90 BASE) MCG/ACT IN AERS
INHALATION_SPRAY | RESPIRATORY_TRACT | Status: DC | PRN
  Administered 2024-08-22: 4 via RESPIRATORY_TRACT

## 2024-08-22 MED ORDER — METOCLOPRAMIDE HCL 5 MG/ML IJ SOLN: 5.0000 mg | Freq: Three times a day (TID) | INTRAMUSCULAR | Status: DC | PRN

## 2024-08-22 MED ORDER — BUPIVACAINE HCL (PF) 0.25 % IJ SOLN
INTRAMUSCULAR | Status: DC | PRN
  Administered 2024-08-22 (×2): 10 mL

## 2024-08-22 MED ORDER — CEFAZOLIN SODIUM-DEXTROSE 1-4 GM/50ML-% IV SOLN
INTRAVENOUS | Status: AC
  Filled 2024-08-22: qty 50

## 2024-08-22 MED ORDER — BUPIVACAINE-EPINEPHRINE 0.5% -1:200000 IJ SOLN
INTRAMUSCULAR | Status: DC | PRN
  Administered 2024-08-22: 10 mL

## 2024-08-22 MED ORDER — BUPIVACAINE LIPOSOME 1.3 % IJ SUSP
INTRAMUSCULAR | Status: DC | PRN
  Administered 2024-08-22: 10 mL

## 2024-08-22 MED ORDER — BUPIVACAINE-EPINEPHRINE (PF) 0.5% -1:200000 IJ SOLN
INTRAMUSCULAR | Status: AC
  Filled 2024-08-22: qty 10

## 2024-08-22 MED ORDER — ROCURONIUM BROMIDE 100 MG/10ML IV SOLN: INTRAVENOUS | Status: DC | PRN

## 2024-08-22 MED ORDER — PROPOFOL 10 MG/ML IV BOLUS
INTRAVENOUS | Status: AC
  Filled 2024-08-22: qty 40

## 2024-08-22 SURGICAL SUPPLY — 71 items
APPLICATOR COTTON TIP 6 STRL (MISCELLANEOUS) ×6 IMPLANT
BENZOIN TINCTURE PRP APPL 2/3 (GAUZE/BANDAGES/DRESSINGS) IMPLANT
BIT DRILL 4X4.5 FOOTPRINT STR (BIT) IMPLANT
BLADE ENDOTRAC PUSH EPF/EGR (MISCELLANEOUS) IMPLANT
BLADE MED AGGRESSIVE (BLADE) IMPLANT
BLADE SURG 15 STRL LF DISP TIS (BLADE) IMPLANT
BLADE SURG MINI STRL (BLADE) ×2 IMPLANT
BLADE SW THK.38XMED LNG THN (BLADE) IMPLANT
BLADE TRIANGLE EPF/EGR ENDO (BLADE) ×2 IMPLANT
BNDG COHESIVE 4X5 TAN STRL LF (GAUZE/BANDAGES/DRESSINGS) ×2 IMPLANT
BNDG ELASTIC 4X5.8 VLCR NS LF (GAUZE/BANDAGES/DRESSINGS) ×2 IMPLANT
BNDG ESMARCH 4X12 STRL LF (GAUZE/BANDAGES/DRESSINGS) ×2 IMPLANT
BNDG GAUZE DERMACEA FLUFF 4 (GAUZE/BANDAGES/DRESSINGS) ×2 IMPLANT
BNDG STRETCH 4X75 STRL LF (GAUZE/BANDAGES/DRESSINGS) ×2 IMPLANT
BNDG STRETCH GAUZE 3IN X12FT (GAUZE/BANDAGES/DRESSINGS) ×2 IMPLANT
BOOT WALKER MEDIUM (SOFTGOODS) IMPLANT
BUR 4X55 1 (BURR) ×2 IMPLANT
CUFF TOURN SGL QUICK 12 (TOURNIQUET CUFF) IMPLANT
CUFF TOURN SGL QUICK 18X4 (TOURNIQUET CUFF) IMPLANT
CUFF TOURN SGL QUICK 34 NS (TOURNIQUET CUFF) IMPLANT
CUFF TRNQT CYL 24X4X16.5-23 (TOURNIQUET CUFF) IMPLANT
DEFOGGER SCOPE WARM SEASHARP (MISCELLANEOUS) ×2 IMPLANT
DRAPE FLUOR MINI C-ARM 54X84 (DRAPES) ×2 IMPLANT
DRSG TELFA 3X4 N-ADH STERILE (GAUZE/BANDAGES/DRESSINGS) IMPLANT
DURAPREP 26ML APPLICATOR (WOUND CARE) ×2 IMPLANT
ELECTRODE REM PT RTRN 9FT ADLT (ELECTROSURGICAL) ×2 IMPLANT
GAUZE SPONGE 4X4 12PLY STRL (GAUZE/BANDAGES/DRESSINGS) ×2 IMPLANT
GAUZE STRETCH 2X75IN STRL (MISCELLANEOUS) ×2 IMPLANT
GAUZE XEROFORM 1X8 LF (GAUZE/BANDAGES/DRESSINGS) ×2 IMPLANT
GLOVE BIO SURGEON STRL SZ7.5 (GLOVE) ×2 IMPLANT
GLOVE INDICATOR 8.0 STRL GRN (GLOVE) ×2 IMPLANT
GOWN STRL REUS W/ TWL XL LVL3 (GOWN DISPOSABLE) ×4 IMPLANT
IV NS 250ML BAXH (IV SOLUTION) ×2 IMPLANT
KIT PRC PRB RTRGD 3ANG KNF HND (MISCELLANEOUS) IMPLANT
KIT TURNOVER KIT A (KITS) ×2 IMPLANT
KWIRE DBL END TROCAR 6X.062 (WIRE) IMPLANT
LABEL OR SOLS (LABEL) ×2 IMPLANT
MANIFOLD NEPTUNE II (INSTRUMENTS) ×2 IMPLANT
NDL HYPO 21X1.5 SAFETY (NEEDLE) IMPLANT
NDL HYPO 25GX1X1/2 BEV (NEEDLE) IMPLANT
NDL HYPO 25X1 1.5 SAFETY (NEEDLE) ×2 IMPLANT
NDL SAFETY ECLIPSE 18X1.5 (NEEDLE) ×2 IMPLANT
NDL SUT 5 .5 CRC TPR PNT MAYO (NEEDLE) IMPLANT
NEEDLE HYPO 21X1.5 SAFETY (NEEDLE) ×2 IMPLANT
NEEDLE HYPO 25GX1X1/2 BEV (NEEDLE) IMPLANT
NEEDLE HYPO 25X1 1.5 SAFETY (NEEDLE) ×2 IMPLANT
NS IRRIG 500ML POUR BTL (IV SOLUTION) ×2 IMPLANT
PACK EXTREMITY ARMC (MISCELLANEOUS) ×2 IMPLANT
PAD ABD DERMACEA PRESS 5X9 (GAUZE/BANDAGES/DRESSINGS) IMPLANT
PADDING CAST BLEND 4X4 NS (MISCELLANEOUS) IMPLANT
PENCIL SMOKE EVACUATOR (MISCELLANEOUS) ×2 IMPLANT
SPLINT CAST 1 STEP 4X30 (MISCELLANEOUS) IMPLANT
STOCKINETTE IMPERV 14X48 (MISCELLANEOUS) ×2 IMPLANT
STRIP CLOSURE SKIN 1/4X4 (GAUZE/BANDAGES/DRESSINGS) ×2 IMPLANT
SUT ETHILON 3-0 (SUTURE) IMPLANT
SUT ETHILON 5-0 FS-2 18 BLK (SUTURE) IMPLANT
SUT ULTRABRAID #2 38 (SUTURE) IMPLANT
SUT VIC AB 2-0 SH 27XBRD (SUTURE) IMPLANT
SUT VIC AB 3-0 SH 27X BRD (SUTURE) ×2 IMPLANT
SUT VIC AB 4-0 SH 27XANBCTRL (SUTURE) ×2 IMPLANT
SUT VICRYL 3-0 CR8 SH (SUTURE) IMPLANT
SUT VICRYL AB 3-0 FS1 BRD 27IN (SUTURE) IMPLANT
SUTURE ETHLN 4-0 FS2 18XMF BLK (SUTURE) IMPLANT
SUTURE MNCRL 4-0 27XMF (SUTURE) ×2 IMPLANT
SYR 10ML LL (SYRINGE) ×2 IMPLANT
SYR 20ML LL LF (SYRINGE) IMPLANT
SYR 5ML LL (SYRINGE) IMPLANT
TRAP FLUID SMOKE EVACUATOR (MISCELLANEOUS) ×2 IMPLANT
WAND TOPAZ MICRO DEBRIDER (MISCELLANEOUS) ×2 IMPLANT
WATER STERILE IRR 500ML POUR (IV SOLUTION) ×2 IMPLANT
WIRE Z .062 C-WIRE SPADE TIP (WIRE) IMPLANT

## 2024-08-22 NOTE — Anesthesia Procedure Notes (Signed)
 Procedure Name: Intubation Date/Time: 08/22/2024 7:36 AM  Performed by: Myra Lawless, CRNAPre-anesthesia Checklist: Patient identified, Patient being monitored, Timeout performed, Emergency Drugs available and Suction available Patient Re-evaluated:Patient Re-evaluated prior to induction Oxygen Delivery Method: Circle system utilized Preoxygenation: Pre-oxygenation with 100% oxygen Induction Type: IV induction Ventilation: Mask ventilation without difficulty Laryngoscope Size: Mac, 3 and 4 Grade View: Grade I Tube type: Oral Tube size: 7.0 mm Number of attempts: 1 Airway Equipment and Method: Stylet Placement Confirmation: ETT inserted through vocal cords under direct vision, positive ETCO2 and breath sounds checked- equal and bilateral Secured at: 21 cm Tube secured with: Tape Dental Injury: Teeth and Oropharynx as per pre-operative assessment  Comments: Smooth IV induction. Easy mask ventilation. DL x1 with McGrath MAC 4 blade, grade 1 view. Atraumatic intubation. Dentition unchanged from preop baseline.

## 2024-08-22 NOTE — H&P (Signed)
 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  The various methods of treatment have been discussed with the patient.  No guarantees were given.  After consideration of risks, benefits and other options for treatment, the patient has consented to surgery.  I have reviewed the patients' chart and labs.     A history and physical examination was performed in my office.  The patient was reexamined.  There have been no changes to this history and physical examination.  [name]

## 2024-08-22 NOTE — Anesthesia Preprocedure Evaluation (Signed)
 Anesthesia Evaluation  Patient identified by MRN, date of birth, ID band Patient awake    Reviewed: Allergy & Precautions, H&P , NPO status , Patient's Chart, lab work & pertinent test results, reviewed documented beta blocker date and time   History of Anesthesia Complications Negative for: history of anesthetic complications  Airway Mallampati: I  TM Distance: >3 FB Neck ROM: full    Dental  (+) Missing, Dental Advidsory Given, Teeth Intact   Pulmonary neg shortness of breath, asthma , neg sleep apnea, neg COPD, Recent URI , Residual Cough   Pulmonary exam normal breath sounds clear to auscultation       Cardiovascular Exercise Tolerance: Good negative cardio ROS Normal cardiovascular exam Rhythm:regular Rate:Normal     Neuro/Psych negative neurological ROS  negative psych ROS   GI/Hepatic Neg liver ROS,GERD  Controlled,,  Endo/Other  neg diabetes  Class 3 obesity  Renal/GU negative Renal ROS  negative genitourinary   Musculoskeletal   Abdominal   Peds  Hematology negative hematology ROS (+)   Anesthesia Other Findings Past Medical History: No date: Arthritis     Comment:  right knee 2012: Asthma 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) No date: Dyspnea     Comment:  exertion 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 No date: Gastrocnemius equinus, right 2008: Migraine No date: Mouth ulcers 2010: Pituitary adenoma (HCC) No date: Plantar fasciitis of right foot No date: Pneumonia     Comment:  covid No date: Tarsal tunnel syndrome of right side   Reproductive/Obstetrics negative OB ROS                              Anesthesia Physical Anesthesia Plan  ASA: 3  Anesthesia Plan: General   Post-op Pain Management:    Induction: Intravenous  PONV Risk Score and Plan: 3 and Ondansetron , Dexamethasone  and Treatment may vary due to age or medical condition  Airway Management Planned: Oral  ETT  Additional Equipment:   Intra-op Plan:   Post-operative Plan: Extubation in OR  Informed Consent: I have reviewed the patients History and Physical, chart, labs and discussed the procedure including the risks, benefits and alternatives for the proposed anesthesia with the patient or authorized representative who has indicated his/her understanding and acceptance.     Dental Advisory Given  Plan Discussed with: Anesthesiologist, CRNA and Surgeon  Anesthesia Plan Comments:          Anesthesia Quick Evaluation

## 2024-08-22 NOTE — Anesthesia Postprocedure Evaluation (Signed)
 Anesthesia Post Note  Patient: [name]  Procedure(s) Performed: RECESSION, TENDON, GASTROCNEMIUS (Right: Leg Lower) FASCIOTOMY, PLANTAR, ENDOSCOPIC (Right: Leg Lower) REPAIR, NERVE (Right: Foot)  Patient location during evaluation: PACU Anesthesia Type: General Level of consciousness: awake and alert Pain management: pain level controlled Vital Signs Assessment: post-procedure vital signs reviewed and stable Respiratory status: spontaneous breathing, nonlabored ventilation, respiratory function stable and patient connected to nasal cannula oxygen Cardiovascular status: blood pressure returned to baseline and stable Postop Assessment: no apparent nausea or vomiting Anesthetic complications: no   No notable events documented.   Last Vitals:  Vitals:   08/22/24 1000 08/22/24 1031  BP: 121/84 121/76  Pulse: 90 66  Resp: 13 14  Temp:  36.4 C  SpO2: 100% 100%    Last Pain:  Vitals:   08/22/24 1031  TempSrc: Temporal  PainSc:                  Prentice Murphy

## 2024-08-22 NOTE — Discharge Instructions (Addendum)
 [facility]  POST OPERATIVE INSTRUCTIONS FOR DR. ASHLEY AND DR. BAKER [facility] CLINIC PODIATRY DEPARTMENT   Take your medication as prescribed.  Pain medication should be taken only as needed.  Take a 325mg  aspirin  twice daily.  Keep the dressing clean, dry and intact.  Keep your foot elevated above the heart level for the first 48 hours.  We have instructed you to be non-weight bearing.  Always wear your post-op shoe when walking.  Always use your crutches if you are to be non-weight bearing.  Do not take a shower. Baths are permissible as long as the foot is kept out of the water.   Every hour you are awake Bend your knee 15 times.  Call [facility] [phone]) if any of the following problems occur: You develop a temperature or fever. The bandage becomes saturated with blood. Medication does not stop your pain. Injury of the foot occurs. Any symptoms of infection including redness, odor, or red streaks running from wound.  Information for Discharge Teaching:  DO NOT REMOVE TEAL EXPAREL  BRACELET FOR 4 DAYS (96 hours) 08/26/2024 EXPAREL  (bupivacaine  liposome injectable suspension)   Pain relief is important to your recovery. The goal is to control your pain so you can move easier and return to your normal activities as soon as possible after your procedure. Your physician may use several types of medicines to manage pain, swelling, and more.  Your surgeon or anesthesiologist gave you EXPAREL (bupivacaine ) to help control your pain after surgery.  EXPAREL  is a local anesthetic designed to release slowly over an extended period of time to provide pain relief by numbing the tissue around the surgical site. EXPAREL  is designed to release pain medication over time and can control pain for up to 72 hours. Depending on how you respond to EXPAREL , you may require less pain medication during your recovery. EXPAREL  can help reduce or  eliminate the need for opioids during the first few days after surgery when pain relief is needed the most. EXPAREL  is not an opioid and is not addictive. It does not cause sleepiness or sedation.   Important! A teal colored band has been placed on your arm with the date, time and amount of EXPAREL  you have received. Please leave this armband in place for the full 96 hours following administration, and then you may remove the band. If you return to the hospital for any reason within 96 hours following the administration of EXPAREL , the armband provides important information that your health care providers to know, and alerts them that you have received this anesthetic.    Possible side effects of EXPAREL : Temporary loss of sensation or ability to move in the area where medication was injected. Nausea, vomiting, constipation Rarely, numbness and tingling in your mouth or lips, lightheadedness, or anxiety may occur. Call your doctor right away if you think you may be experiencing any of these sensations, or if you have other questions regarding possible side effects.  Follow all other discharge instructions given to you by your surgeon or nurse. Eat a healthy diet and drink plenty of water or other fluids.

## 2024-08-22 NOTE — Op Note (Signed)
 Operative note   Surgeon:Mishka Stegemann Armed forces logistics/support/administrative officer: None    Preop diagnosis: 1.  Gastroc equinus right lower extremity 2.  Plantar fasciitis right heel 3.  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  Strayer gastroc recession right lower extremity 2.  Endoscopic plantar fasciotomy right heel 3.  Baxters nerve release with distal tarsal tunnel release medial right heel    EBL: Minimal    Anesthesia: General With local.  Local consisted of a total of 10 cc of Exparel  long-acting anesthetic infiltrated with 0.5% bupivacaine .  Further supplementation with 0.25% bupivacaine  intraoperatively.    Hemostasis: Thigh tourniquet inflated to 250 mmHg for approximately 25 minutes    Specimen: None    Complications: None    Operative indications:[name] is an 38 y.o. that presents today for surgical intervention.  The risks/benefits/alternatives/complications have been discussed and consent has been given.    Procedure:  Patient was brought into the OR and placed on the operating table in theprone position. After anesthesia was obtained theright lower extremity was prepped and draped in usual sterile fashion.  Attention was initially directed to the posterior aspect of the right calf at the gastroc soleal junction where a longitudinal incision was performed.  Sharp and blunt dissection carried down to the peritenon.  The small saphenous vein and saphenous nerve were noted throughout the procedure and retracted.  The peritenon was incised.  The gastroc recession was performed from medial to lateral and good excursion and release of the aponeurosis was noted.  The deep muscle belly was exposed.  The wound was flushed with copious amounts of irrigation.  The peritenon was then closed with a 3-0 Vicryl.  Subcutaneous tissue closed with 3-0 Vicryl and the skin closed with a 4-0 Monocryl.  The tourniquet was then inflated.  Attention was directed to  the medial aspect of the heel at approximately 2 cm superior and 5 cm from posterior heel a small incision was made.  Blunt dissection carried down to the fascia.  The fascial elevator was introduced.  The blunt trocar and cannula was placed just inferior to the plantar fascia and a small stab incision was made laterally in the trocar and cannula was placed through this area.  The trocar was removed.  The camera was used and the plantar fascia was mapped out and easily visualized.  The medial one half of the plantar fascia was then released with a small blade.  The deep muscle was noted at this time.  The trocar was reintroduced and the trocar cannula was removed.  Next the medial aspect of the heel was then incised more proximal to the distal tarsal tunnel region.  The superficial fascia was noted initially.  This was incised.  The muscle belly was found.  This was reflected both proximal and distal and the deep fascia was released.  This exposed the deep adipose tissue along the distal tarsal tunnel region.  Blunt evaluation did not reveal any further fascial layers.  The wound was then flushed with copious amounts of irrigation.  The subcutaneous tissue was closed with a 3-0 Vicryl and the skin closed with a 3-0 nylon both medial and lateral.  Finally in a grid like fashion along the plantar medial heel the Topaz wand was introduced percutaneously and placed through the plantar fascial ligament.  Final infiltration with 0.25% bupivacaine  was used along the surgical sites.  A bulky sterile dressing was  applied.  Patient was placed in a neutral position and a equalizer walker boot was fashioned to the lower extremity.    Patient tolerated the procedure and anesthesia well.  Was transported from the OR to the PACU with all vital signs stable and vascular status intact. To be discharged per routine protocol.  Will follow up in approximately 1 week in the outpatient clinic.

## 2024-08-22 NOTE — Transfer of Care (Signed)
 Immediate Anesthesia Transfer of Care Note  Patient: [name]  Procedure(s) Performed: RECESSION, TENDON, GASTROCNEMIUS (Right: Leg Lower) FASCIOTOMY, PLANTAR, ENDOSCOPIC (Right: Leg Lower) REPAIR, NERVE (Right: Foot)  Patient Location: PACU  Anesthesia Type:General  Level of Consciousness: drowsy  Airway & Oxygen Therapy: Patient Spontanous Breathing and Patient connected to face mask oxygen  Post-op Assessment: Report given to RN, Post -op Vital signs reviewed and stable, and Patient moving all extremities X 4  Post vital signs: Reviewed and stable  Last Vitals:  Vitals Value Taken Time  BP 118/80 08/22/24 09:13  Temp    Pulse 92 08/22/24 09:13  Resp 30 08/22/24 09:13  SpO2 100 % 08/22/24 09:13  Vitals shown include unfiled device data.  Last Pain:  Vitals:   08/22/24 0617  TempSrc: Temporal  PainSc: 0-No pain     Patent airway to PACU, breathing spontaneously on 6L O2 via FM. Comfortable.    Complications: No notable events documented.

## 2024-08-26 ENCOUNTER — Encounter: Payer: Self-pay | Admitting: Podiatry

## 2024-09-22 ENCOUNTER — Encounter: Payer: Self-pay | Admitting: Podiatry

## 2025-01-16 ENCOUNTER — Other Ambulatory Visit: Payer: Self-pay | Admitting: Podiatry

## 2025-01-16 DIAGNOSIS — M7671 Peroneal tendinitis, right leg: Secondary | ICD-10-CM
